# Patient Record
Sex: Female | Born: 1985 | Race: White | Hispanic: No | Marital: Married | Smoking: Never smoker
Health system: Southern US, Community
[De-identification: ages and names within clinical notes are randomized; demographics above are authoritative.]

## PROBLEM LIST (undated history)

## (undated) ENCOUNTER — Inpatient Hospital Stay (HOSPITAL_COMMUNITY): Payer: Self-pay

## (undated) DIAGNOSIS — E119 Type 2 diabetes mellitus without complications: Secondary | ICD-10-CM

---
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
## 2004-01-03 ENCOUNTER — Ambulatory Visit: Payer: Self-pay | Admitting: Internal Medicine

## 2004-01-22 ENCOUNTER — Emergency Department (HOSPITAL_COMMUNITY): Admission: EM | Admit: 2004-01-22 | Discharge: 2004-01-22 | Payer: Self-pay | Admitting: Emergency Medicine

## 2004-04-17 ENCOUNTER — Emergency Department (HOSPITAL_COMMUNITY): Admission: EM | Admit: 2004-04-17 | Discharge: 2004-04-17 | Payer: Self-pay | Admitting: Emergency Medicine

## 2004-04-17 IMAGING — CT CT HEAD W/O CM
1 series · 16 of 30 positions shown, 20 images · IV contrast (agent unspecified)
Comparison: none

CLINICAL DATA: Headache, fever.
CT HEAD WITHOUT CONTRAST:
Multidetector helical CT scanning obtained from the skull base to the vertex.
No evidence of acute intracranial abnormality including mass or mass effect, hydrocephalus, extra-axial fluid collection, midline shift, hemorrhage, infarct.  Acute infarct may be missed by CT for 24-48 hours.  Visualized bony calvarium and paranasal sinuses are unremarkable.

[Series 2: head_seq 4.5 h40s st · axial · 0.43mm/px · z∈[+1025,+1151]mm · 16 of 32 slices shown, 20 images]
[im 2/32  brain]
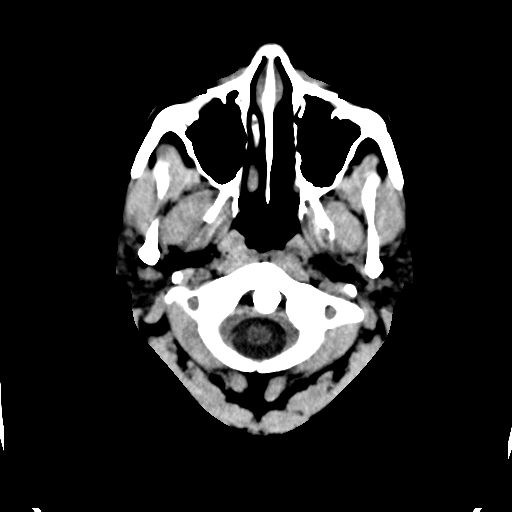
[im 2/32  bone]
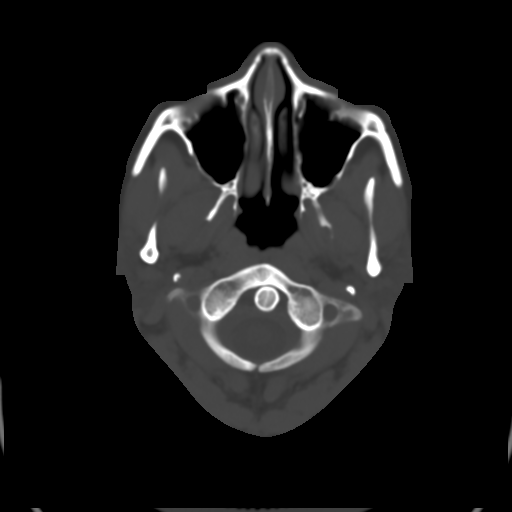
[im 4/32  brain]
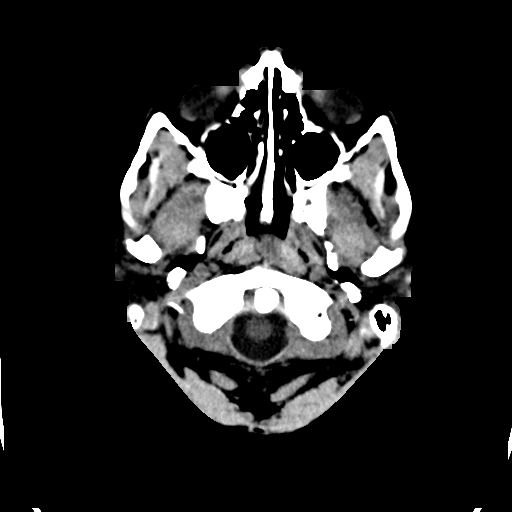
[im 6/32  brain]
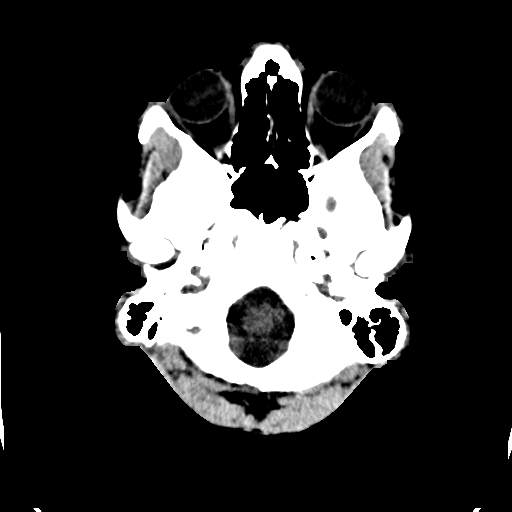
[im 8/32  brain]
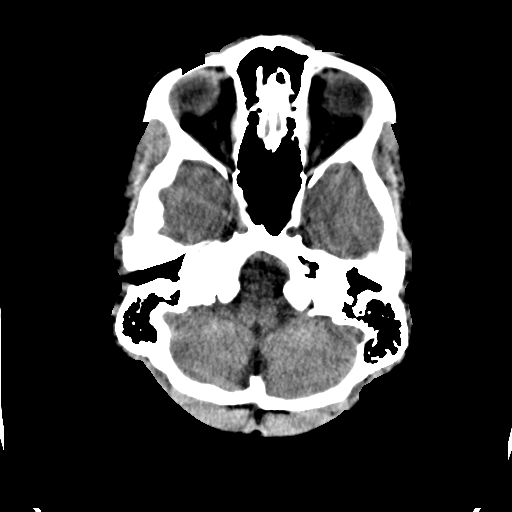
[im 9/32  brain]
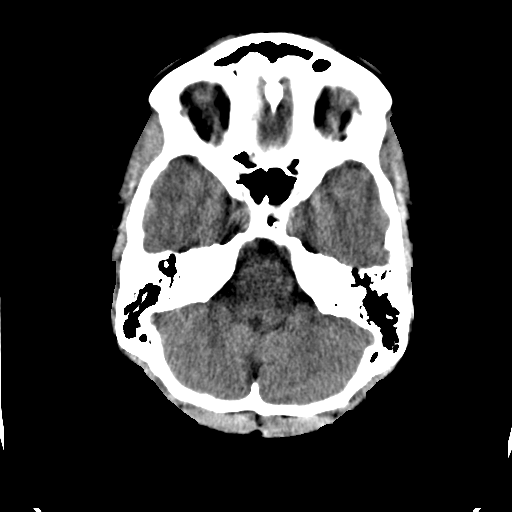
[im 9/32  bone]
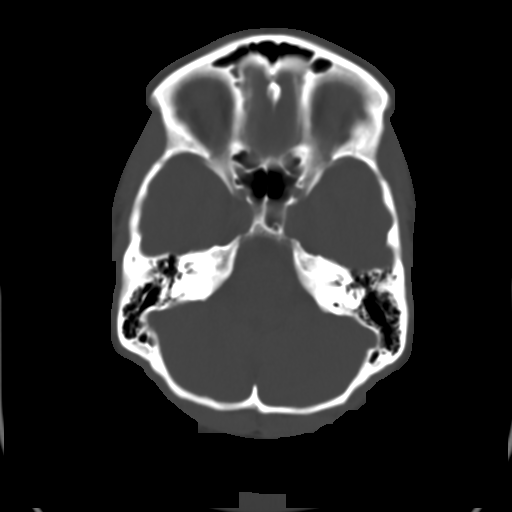
[im 11/32  brain]
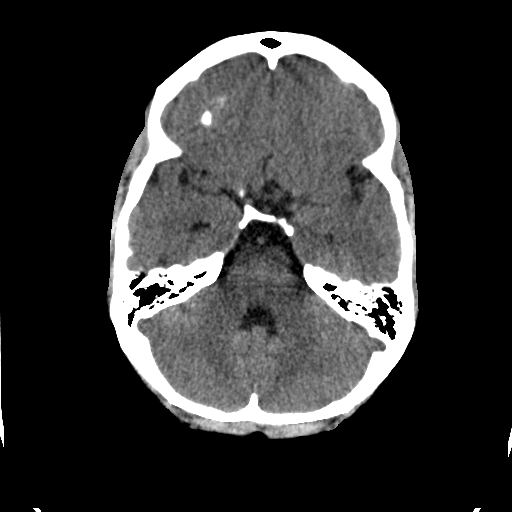
[im 13/32  brain]
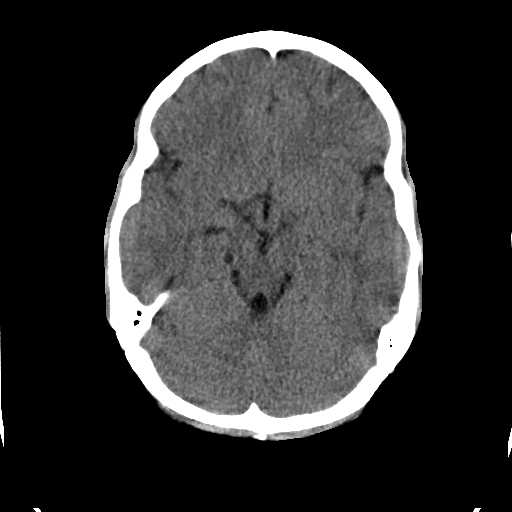
[im 15/32  brain]
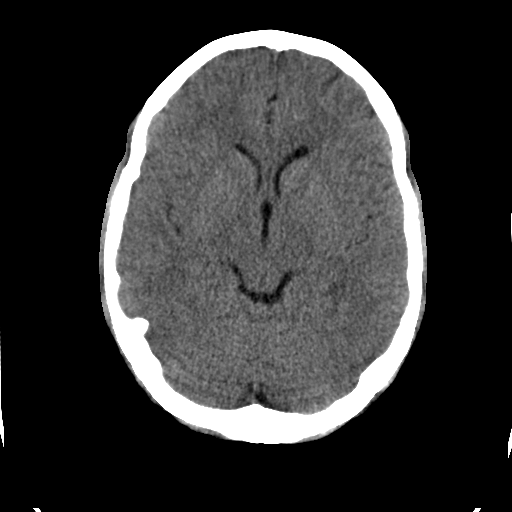
[im 17/32  brain]
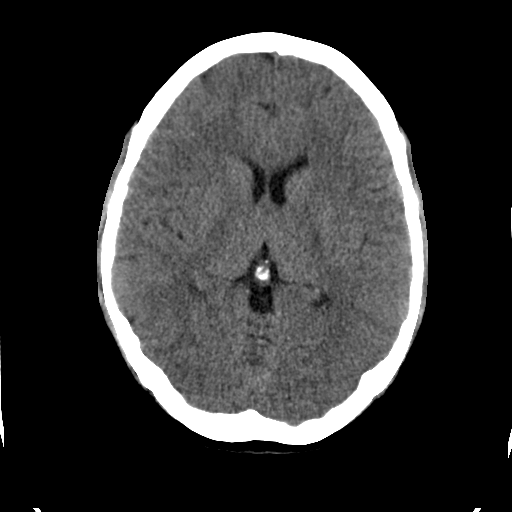
[im 17/32  bone]
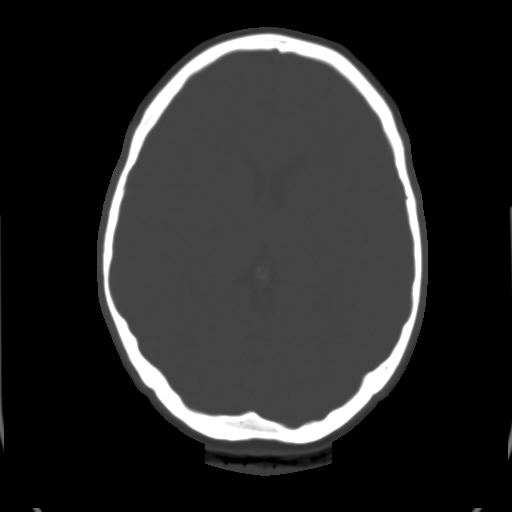
[im 19/32  brain]
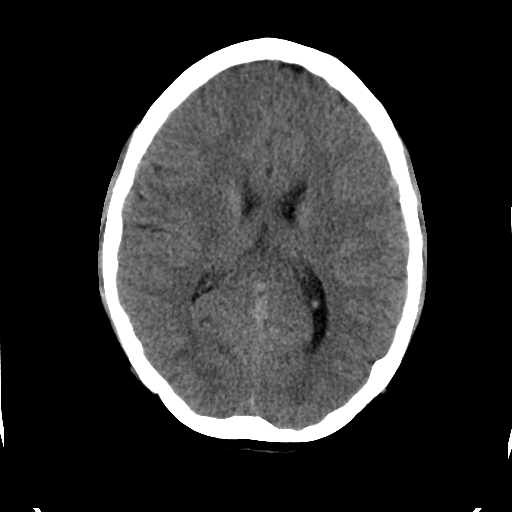
[im 21/32  brain]
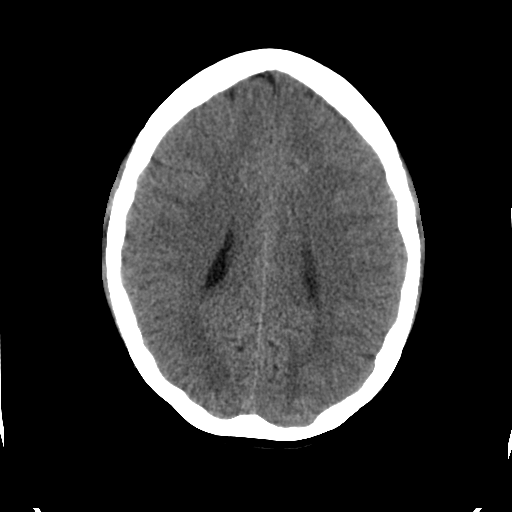
[im 23/32  brain]
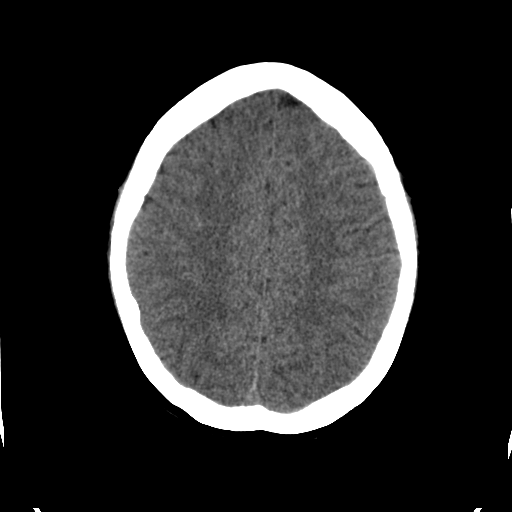
[im 24/32  brain]
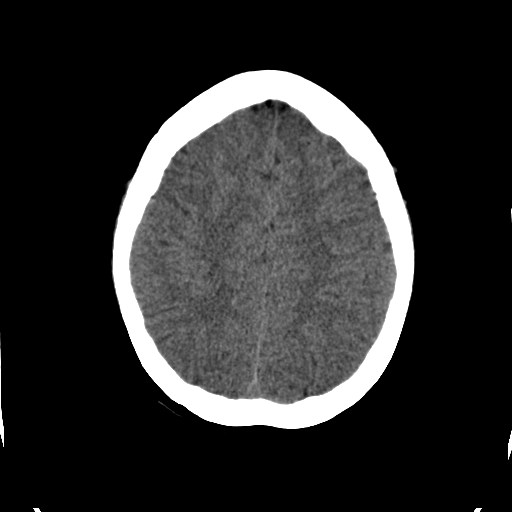
[im 24/32  bone]
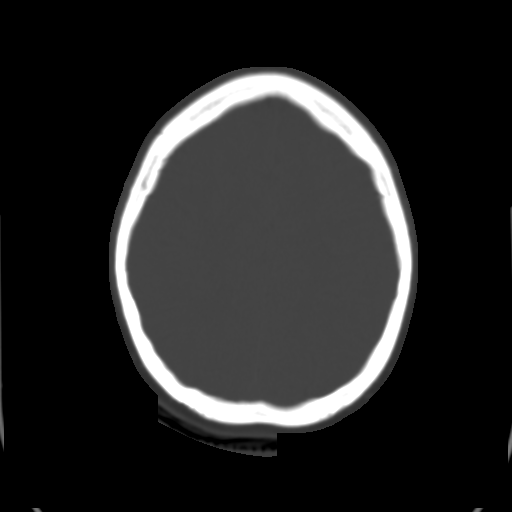
[im 26/32  brain]
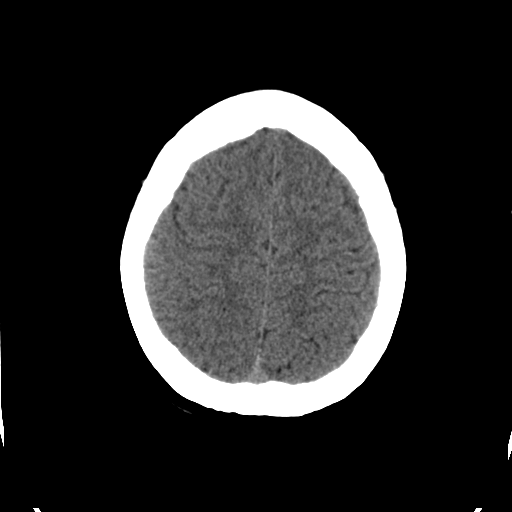
[im 28/32  brain]
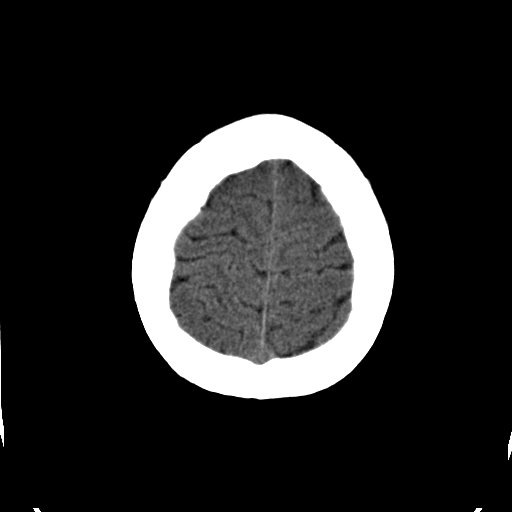
[im 30/32  brain]
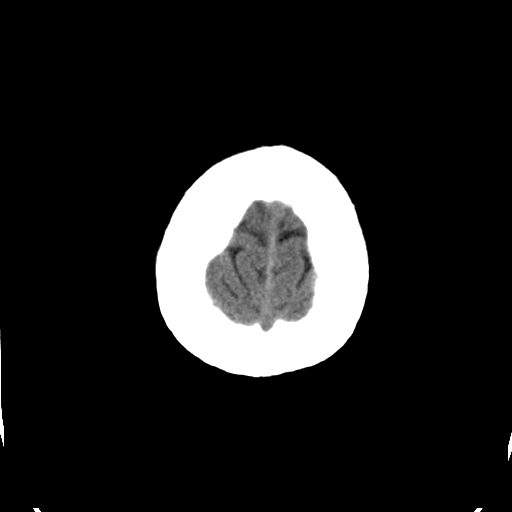

[16 of 30 positions shown; findings below may reference images not displayed]

IMPRESSION: No evidence of acute intracranial abnormality.

## 2005-01-21 ENCOUNTER — Emergency Department (HOSPITAL_COMMUNITY): Admission: EM | Admit: 2005-01-21 | Discharge: 2005-01-21 | Payer: Self-pay | Admitting: Emergency Medicine

## 2007-06-03 ENCOUNTER — Emergency Department (HOSPITAL_COMMUNITY): Admission: EM | Admit: 2007-06-03 | Discharge: 2007-06-03 | Payer: Self-pay | Admitting: Emergency Medicine

## 2007-11-28 ENCOUNTER — Emergency Department (HOSPITAL_COMMUNITY): Admission: EM | Admit: 2007-11-28 | Discharge: 2007-11-28 | Payer: Self-pay | Admitting: Emergency Medicine

## 2008-03-08 ENCOUNTER — Emergency Department (HOSPITAL_COMMUNITY): Admission: EM | Admit: 2008-03-08 | Discharge: 2008-03-08 | Payer: Self-pay | Admitting: Emergency Medicine

## 2010-05-21 LAB — URINALYSIS, ROUTINE W REFLEX MICROSCOPIC
Bilirubin Urine: NEGATIVE
Glucose, UA: 250 mg/dL — AB
Hgb urine dipstick: NEGATIVE
Ketones, ur: NEGATIVE mg/dL
Nitrite: NEGATIVE
Protein, ur: NEGATIVE mg/dL
Specific Gravity, Urine: 1.013 (ref 1.005–1.030)
Urobilinogen, UA: 0.2 mg/dL (ref 0.0–1.0)
pH: 7.5 (ref 5.0–8.0)

## 2010-05-21 LAB — CBC
MCHC: 34.3 g/dL (ref 30.0–36.0)
Platelets: 271 10*3/uL (ref 150–400)
RDW: 12.6 % (ref 11.5–15.5)

## 2010-05-21 LAB — GLUCOSE, CAPILLARY: Glucose-Capillary: 83 mg/dL (ref 70–99)

## 2010-05-21 LAB — COMPREHENSIVE METABOLIC PANEL
ALT: 12 U/L (ref 0–35)
AST: 16 U/L (ref 0–37)
Albumin: 3.3 g/dL — ABNORMAL LOW (ref 3.5–5.2)
Calcium: 8.8 mg/dL (ref 8.4–10.5)
GFR calc Af Amer: >60 mL/min (ref >60)
Glucose, Bld: 105 mg/dL — ABNORMAL HIGH (ref 70–99)
Sodium: 137 mEq/L (ref 135–145)
Total Protein: 6.1 g/dL (ref 6.0–8.3)

## 2010-05-21 LAB — DIFFERENTIAL
Eosinophils Absolute: 0 10*3/uL (ref 0.0–0.7)
Lymphs Abs: 2.3 10*3/uL (ref 0.7–4.0)
Monocytes Absolute: 0.4 10*3/uL (ref 0.1–1.0)
Monocytes Relative: 6 % (ref 3–12)
Neutrophils Relative %: 61 % (ref 43–77)

## 2010-05-21 LAB — POCT PREGNANCY, URINE: Preg Test, Ur: NEGATIVE

## 2010-06-21 NOTE — Discharge Summary (Signed)
[place]  Patient:    [name], [name]                         MRN: [id number] Adm. Date:  [id number] Disc. Date: [id number] Attending:  Julian Hy                           Discharge Summary  DISCHARGE DIAGNOSES: 1. New onset type 1 diabetes with mild diabetic ketoacidosis. 2. Euthyroid goiter. 3. Urinary tract infection. 4. Mild dehydration due to #1.  HISTORY OF PRESENT ILLNESS:  [name] is a 25-6/26 year old white female who presented with a one week history of malaise, excess fatigue, and sleepiness with nocturia one to two times.  She was found to have a new finding of abnormal blood sugar of 387 by fingerstick, an A1C of 13.9%, and had a urinary tract infection also co-existing.  She was admitted, treated with oral hydration and initiation of insulin therapy.  The patient did quite well.  Her insulin needs ended up being significantly more than initially anticipated.  Blood sugars did vary quite a bit.  The patient did well psychologically and extensive teaching was undertaken with the patients family.  Blood sugars did stabilize and she was discharged home in improved condition on May 01, 1999.  Her urinary tract infection symptoms improved during the hospitalization.  She had no evidence of other underlying infection.  She did quite well with all the technical aspects of her diabetes management.  DISCHARGE MEDICATIONS: 1. NPH 14 units at breakfast, 14 units at bedtime. 2. Humalog not if below 100, three units if 101 to 130, five units if 131 to    175, and seven units if over 176.  She was to use this at breakfast and    supper.  DISCHARGE INSTRUCTIONS:  She was to complete Cipro one twice a day for two additional days.  She was to check her sugars before each meal and at bedtime and keep a written record.  She is to call if sugars go below 65 or above 300. She is to follow up in a week to 10 days.  LABORATORY DATA:  Sodium  135, potassium 3.3, chloride 105, CO2 20, BUN 8, creatinine 1.0, glucose 234.  Note, she got six units of Humalog in the office before this measurement was done.  Serum ketones were moderate.  TSH was 0.640, C peptide was 1.6.  HOSPITAL COURSE:  A 26-4/26 year old white female presenting with new onset, type 1 diabetes.  She had moderate ketoacidosis and dehydration with a superimposed problem of a urinary tract infection.  The patient did well with oral hydration, was initiated on insulin therapy and did well.  She was discharged home in improved condition. DD:  05/23/99 TD:  05/23/99 Job: [id number]

## 2010-06-21 NOTE — Discharge Summary (Signed)
[name], [name]                            ACCOUNT NO.:  [id number]   MEDICAL RECORD NO.:  [id number]                   PATIENT TYPE:  INP   LOCATION:  0355                                 FACILITY:  [facility]   PHYSICIAN:  Tera Mater. Evlyn Kanner, M.D.              DATE OF BIRTH:  [birth year]   DATE OF ADMISSION:  09/17/2003  DATE OF DISCHARGE:  09/19/2003                                 DISCHARGE SUMMARY   DISCHARGE DIAGNOSES:  1. Diabetic ketoacidosis, resolved.  2. Known type 1 diabetes with fair diabetes control.  3. Probable otitis media precipitating #1.  4. Minor hypokalemia.   CONSULTATIONS:  None.   IMAGING STUDIES:  None.   HISTORY:  [name] is an 25 year old white female with a known history of  type 1 diabetes for 4 years.  She presented with fairly moderately advanced  diabetic ketoacidosis, to my partner, Dr. Felipa Eth, on September 17, 2003.  She was  treated routinely with IV insulin drip, vigorous fluid resuscitation, and  careful following of her electrolytes.  She was transitioned back to  injectable insulin yesterday morning and had incomplete resolution of her  acidosis.  This is now completely resolved.  She is eating well and fasting  blood sugar is in the acceptable, although not normal range at 170.  She  slept well.  She had no nausea and vomiting.  She has had no significant  electrolytes disarray other than mild hypokalemia today that is  asymptomatic.  She has had no hypoglycemia while here.  She has been seen by  the diabetes coordinator and has a visit in our office on Thursday with our  diabetes educator.  She seems to have returned to baseline.  She has had no  fever while here, but has been covered for infection.  I think I will  continue out a few more days of this just in case this otitis media did  precipitate this.  At the present, she is doing well.   LABORATORY DATA:  Laboratory review at presentation, pH was 7.313, PCO2 of  27.5, O2 of 112, bicarb of  13.5, initial white count was 11,100, hemoglobin  14.5, platelets 227,000.  This morning, the white count is 4900, hemoglobin  12.2, MCV 98.6, platelets 213,000.  Laboratories in the ER showed a sodium  of 133, potassium 3.9, BUN 15, glucose 299, and a fingerstick blood sugar  was over 600 before all of this was done.  Bicarb was 8 in the ER, I  believe.  Initial chemistries, sodium 131, potassium 3.9, chloride 115, CO2  11, BUN 10, creatinine 0.7, glucose was 169.  This morning, laboratories  show sodium 138, potassium 2.2, chloride 110, CO2 25, BUN 5, creatinine 0.6,  glucose 155.  Liver function testing yesterday was perfectly normal with an  alkaline phosphatase of 42, total bilirubin 1.0, SGOT 12, SGPT 9, total  protein 4.8, albumin  2.4, calcium 7.8.  Serum acetone today was negative.  A  urinalysis at presentation, 1.040, glucose greater than 1000, ketones  greater than 80 mg/dL, 30 mg/dL glucose, 0-2 white cells, 11-20 red cells;  they are menses related.   SUMMARY:  We have an 25 year old known type 1 diabetic presenting with  diabetic ketoacidosis to a moderate degree.  She has been treated and doing  well and now is safe to go home.  We will increase her NPH dose to 46 in the  morning and 22 in the evening.  She will continue her Novolog t.i.d. with  the scale as written with carbohydrate-  counting basis.  She will be on Ortho Tri-Cyclen patch or the appropriate  name for this, and she will finish out Cipro 500 mg XL for 5 more days.  She  has an appointment with [place] on Thursday.  Her diet will be as before.  No  pain control is needed.                                               Tera Mater. Evlyn Kanner, M.D.    SAS/MEDQ  D:  09/19/2003  T:  09/19/2003  Job:  [id number]

## 2010-06-21 NOTE — H&P (Signed)
NAMEARLITA, BUFFKIN                            ACCOUNT NO.:  [id number]   MEDICAL RECORD NO.:  [id number]                   PATIENT TYPE:  INP   LOCATION:  1826                                 FACILITY:  [facility]   PHYSICIAN:  Larina Earthly, M.D.                     DATE OF BIRTH:  [birth year]   DATE OF ADMISSION:  09/16/2003  DATE OF DISCHARGE:                                HISTORY & PHYSICAL   CHIEF COMPLAINT:  Headache and fatigue.   HISTORY OF PRESENT ILLNESS:  This is an 25 year old Caucasian female who has  had diabetes for approximately 4 years and no history of DKA.  She working  full time at the [employer] and recently had a miscarriage, approximately 6  weeks ago and was put on hormone patches by Dr. Billy Coast with plans to remove  the patch on September 17, 2003 to precipitate a menstrual period. Her diabetic  control has been poor and indeed when she was last seen in the office  approximately 1 month ago her hemoglobin A1C was in excess of 10%.  She was  placed on a new Insulin regimen consisting of InnoLet with NPH and Novolog  sliding scale.  With this regimen patient states that her a.m. CBCs have  been ranging in the 80 to 90 range and prior to lunch and dinner her blood  sugars have been ranging in the 170 to 180 range and at bedtime her blood  sugars have ranging in the 250 range.   On September 15, 2003 the patient began experiencing malaise all day but no  other specific symptoms and then on the early morning of September 16, 2003 the  patient awoke with a headache and vertigo with a blood sugar of 148.  As the  day progressed she did experience nausea but no vomiting, along with  anorexia.  Her blood sugars rose to the 280 to 290 range.  She also had some  visual abnormalities consisting of spots and abdominal cramping on the right  side which she attributed to an ensuing menstrual period.  However given the  continuation of her symptoms she presented to the emergency room where  she  was found to be in DKA as evidenced by labs below.  She was initiated on IV  fluids and a ________.   REVIEW OF SYSTEMS:  As above, otherwise essentially negative but  specifically negative for chest pain, shortness of breath, no  musculoskeletal or neurological deficits with the exception of seeing spots  in her vision which has since resolved in the emergency room with IV fluids.   PROBLEM LIST:  1. Type 1 diabetes dated back to the age of 47.  2. Miscarriage approximately 6 weeks ago.   CURRENT MEDICATIONS:  1. InnoLet NPH 44 units in the morning and 18 units in the evening.  2. Novolog t.i.d. with sliding  scale prior to meals.  3. Ortho-Tri-Cyclen patch, however question the name of the patch.   ALLERGIES:  No known drug allergies.   SOCIAL HISTORY:  The patient is single.  She has no tobacco, alcohol or drug  abuse history.  Works at [employer] full time.   FAMILY HISTORY:  Significant for type 2 diabetes in grandparents along with  hypertension, hyperlipidemia and cerebrovascular accident.   LABORATORY DATA:  White blood cell count 10,100, hemoglobin 17.5, hematocrit  50%, platelet count 382,000.  Sodium 133, potassium 3.9, BUN 15, glucose  299.  Her pH was 7.066, PCO2 12, PO2 130, bicarb 3.8, 97% oxygen saturation.  Urinalysis negative.   PHYSICAL EXAMINATION:  GENERAL:  We have a pleasant Caucasian female lying  in bed in no apparent distress answering all questions appropriately,  feeling better after IV fluid administration.  VITAL SIGNS:  Temperature 98 degrees, blood pressure 107/56, pulse 103,  respirations 19, oxygen saturation 100%.  HEENT:  Sclerae anicteric.  Extraocular movements are intact. There are no  oropharyngeal lesions.  There is no significant rhinitis.  Tympanic  membranes are red bilaterally with effusion.  NECK:  Supple, there is no cervical lymphadenopathy.  No thyromegaly.  LUNGS:  Clear to auscultation bilaterally.  CARDIOVASCULAR:   Regular rate and rhythm. There is no axillary adenopathy.  ABDOMEN:  Soft, nontender, nondistended.  Bowel sounds are present.  EXTREMITIES:  No edema.  Pedal pulses are intact. There is no active  synovitis.  Full range of motion in all 4 extremities.  NEUROLOGIC:  Exam is grossly nonfocal.   ASSESSMENT/PLAN:  1. Diabetic ketoacidosis. We will provide customary IV fluids, serial     assessment of bicarb and renal and electrolyte parameters.  Will add     potassium when potassium is less than 4.5 and will start _________ and     add D5 to IV fluids when CBG is less than 250.   1. Otitis media.  Will give Rocephin and mucolytic agents and monitor for     improvement.   1. The patient will need education for DKA and Insulin management which is     planned for out office this coming week with our certified diabetes     educator.                                                Larina Earthly, M.D.    RA/MEDQ  D:  09/17/2003  T:  09/17/2003  Job:  [id number]   cc:   Jeannett Senior A. Evlyn Kanner, M.D.  [address]  [place]  [place] [address]  Fax: [phone]   Lenoard Aden, M.D.  [address]  [place]  [place] [address]  Fax: [phone]

## 2010-10-29 LAB — COMPREHENSIVE METABOLIC PANEL WITH GFR
ALT: 9
AST: 17
Albumin: 2.8 — ABNORMAL LOW
CO2: 22
Calcium: 8.4
Creatinine, Ser: 0.93
GFR calc Af Amer: >60
GFR calc non Af Amer: >60
Sodium: 138

## 2010-10-29 LAB — COMPREHENSIVE METABOLIC PANEL
Alkaline Phosphatase: 44
BUN: 16
Chloride: 109
Glucose, Bld: 201 — ABNORMAL HIGH
Potassium: 4.4
Total Bilirubin: 2.2 — ABNORMAL HIGH
Total Protein: 5.8 — ABNORMAL LOW

## 2010-10-29 LAB — DIFFERENTIAL
Basophils Absolute: 0
Basophils Relative: 0
Eosinophils Absolute: 0
Eosinophils Relative: 0
Lymphocytes Relative: 3 — ABNORMAL LOW
Lymphs Abs: 0.3 — ABNORMAL LOW
Monocytes Absolute: 0.3
Monocytes Relative: 3
Neutro Abs: 9.4 — ABNORMAL HIGH
Neutrophils Relative %: 94 — ABNORMAL HIGH

## 2010-10-29 LAB — URINALYSIS, ROUTINE W REFLEX MICROSCOPIC
Glucose, UA: >1000 — AB
Hgb urine dipstick: NEGATIVE
Ketones, ur: >80 — AB
Leukocytes, UA: NEGATIVE
Nitrite: POSITIVE — AB
Protein, ur: NEGATIVE
Specific Gravity, Urine: 1.034 — ABNORMAL HIGH
Urobilinogen, UA: 0.2
pH: 5.5

## 2010-10-29 LAB — CBC
HCT: 39.5
Hemoglobin: 14.1
MCHC: 35.7
MCV: 90.8
Platelets: 220
RBC: 4.34
RDW: 12.2
WBC: 10

## 2010-10-29 LAB — POCT PREGNANCY, URINE: Operator id: 285841

## 2010-10-29 LAB — URINE MICROSCOPIC-ADD ON

## 2010-11-04 LAB — GLUCOSE, CAPILLARY

## 2014-03-15 ENCOUNTER — Other Ambulatory Visit: Payer: Self-pay | Admitting: Obstetrics and Gynecology

## 2014-03-15 ENCOUNTER — Encounter (HOSPITAL_COMMUNITY): Payer: Self-pay | Admitting: *Deleted

## 2014-03-15 NOTE — H&P (Signed)
[name]:  [name], [name]                ACCOUNT NO.:  [id number]  MEDICAL RECORD NO.:  [id number]  LOCATION:  [facility]                         FACILITY:  [facility]  PHYSICIAN:  Lenoard Adenichard J. [name], M.D.DATE OF BIRTH:  04-May-1985  DATE OF ADMISSION:  03/15/2014 DATE OF DISCHARGE:                             HISTORY & PHYSICAL   CHIEF COMPLAINT:  Missed AB.  HISTORY OF PRESENT ILLNESS:  A 29 year old white female G2, P0 who presents with missed AB.  MEDICATIONS:  Include NovoLog, vitamin, and prenatal vitamins.  SOCIAL HISTORY:  She is a nonsmoker, nondrinker.  Denies domestic or physical violence.  ALLERGIES:  She has no known drug allergies.  FAMILY HISTORY:  Lung cancer, diabetes, melanoma, heart disease, preeclampsia, and hypertension.  PAST SURGICAL HISTORY:  Noncontributory except for miscarriage x1.  PAST MEDICAL HISTORY:  Medical problems to include insulin-dependent diabetes.  PHYSICAL EXAMINATION:  GENERAL:  She is a well-developed, well- nourished, white female, in no acute distress.  HEENT:  Normal.  NECK:  Supple.  Full range of motion.  LUNGS:  Clear to auscultation.  ABDOMEN:  Soft, nontender.  PELVIC:  Bulky, anteflexed uterus.  No adnexal masses.  EXTREMITIES:  There are no cords.  NEUROLOGIC:  Nonfocal.  SKIN:  Intact.  IMPRESSION: 1. Missed abortion. 2. Insulin-dependent diabetes.  PLAN:  Proceed with debridement.  Risks of anesthesia, infection, bleeding, injury to surrounding organs, possible need for repair was discussed.  Delayed versus immediate complications to include bowel and bladder injury noted.  The patient acknowledges and wishes to proceed.     Lenoard Adenichard J. [name], M.D.     RJT/MEDQ  D:  03/15/2014  T:  03/15/2014  Job:  [id number]

## 2014-03-16 ENCOUNTER — Ambulatory Visit (HOSPITAL_COMMUNITY)
Admission: RE | Admit: 2014-03-16 | Discharge: 2014-03-16 | Disposition: A | Discharge status: Home / Self Care | Payer: BLUE CROSS/BLUE SHIELD | Source: Ambulatory Visit | Attending: Obstetrics and Gynecology | Admitting: Obstetrics and Gynecology

## 2014-03-16 ENCOUNTER — Encounter (HOSPITAL_COMMUNITY)
Admission: RE | Disposition: A | Discharge status: Home / Self Care | Payer: Self-pay | Source: Ambulatory Visit | Attending: Obstetrics and Gynecology

## 2014-03-16 ENCOUNTER — Ambulatory Visit (HOSPITAL_COMMUNITY): Payer: BLUE CROSS/BLUE SHIELD | Admitting: Anesthesiology

## 2014-03-16 ENCOUNTER — Encounter (HOSPITAL_COMMUNITY): Payer: Self-pay | Admitting: *Deleted

## 2014-03-16 DIAGNOSIS — Z3A01 Less than 8 weeks gestation of pregnancy: Secondary | ICD-10-CM | POA: Diagnosis not present

## 2014-03-16 DIAGNOSIS — O021 Missed abortion: Secondary | ICD-10-CM | POA: Insufficient documentation

## 2014-03-16 DIAGNOSIS — Z794 Long term (current) use of insulin: Secondary | ICD-10-CM | POA: Insufficient documentation

## 2014-03-16 DIAGNOSIS — E119 Type 2 diabetes mellitus without complications: Secondary | ICD-10-CM | POA: Insufficient documentation

## 2014-03-16 HISTORY — DX: Type 2 diabetes mellitus without complications: E11.9

## 2014-03-16 HISTORY — PX: DILATION AND EVACUATION: SHX1459

## 2014-03-16 LAB — CBC
HEMATOCRIT: 40.3 % (ref 36.0–46.0)
Hemoglobin: 14.3 g/dL (ref 12.0–15.0)
MCH: 32.1 pg (ref 26.0–34.0)
MCHC: 35.5 g/dL (ref 30.0–36.0)
MCV: 90.6 fL (ref 78.0–100.0)
PLATELETS: 238 10*3/uL (ref 150–400)
RBC: 4.45 MIL/uL (ref 3.87–5.11)
RDW: 12.5 % (ref 11.5–15.5)
WBC: 6.1 10*3/uL (ref 4.0–10.5)

## 2014-03-16 LAB — BASIC METABOLIC PANEL
ANION GAP: 3 — AB (ref 5–15)
BUN: 11 mg/dL (ref 6–23)
CALCIUM: 9 mg/dL (ref 8.4–10.5)
CHLORIDE: 105 mmol/L (ref 96–112)
CO2: 26 mmol/L (ref 19–32)
Creatinine, Ser: 0.66 mg/dL (ref 0.50–1.10)
GFR calc Af Amer: >90 mL/min (ref >90)
GFR calc non Af Amer: >90 mL/min (ref >90)
GLUCOSE: 247 mg/dL — AB (ref 70–99)
Potassium: 4.1 mmol/L (ref 3.5–5.1)
SODIUM: 134 mmol/L — AB (ref 135–145)

## 2014-03-16 LAB — ABO/RH: ABO/RH(D): B POS

## 2014-03-16 LAB — GLUCOSE, CAPILLARY: GLUCOSE-CAPILLARY: 183 mg/dL — AB (ref 70–99)

## 2014-03-16 SURGERY — DILATION AND EVACUATION, UTERUS
Anesthesia: Monitor Anesthesia Care | Site: Uterus

## 2014-03-16 MED ORDER — PROMETHAZINE HCL 25 MG/ML IJ SOLN: 6.2500 mg | INTRAMUSCULAR | Status: DC | PRN

## 2014-03-16 MED ORDER — PHENYLEPHRINE 40 MCG/ML (10ML) SYRINGE FOR IV PUSH (FOR BLOOD PRESSURE SUPPORT)
PREFILLED_SYRINGE | INTRAVENOUS | Status: AC
  Filled 2014-03-16: qty 10

## 2014-03-16 MED ORDER — BUPIVACAINE HCL (PF) 0.25 % IJ SOLN
INTRAMUSCULAR | Status: AC
  Filled 2014-03-16: qty 30

## 2014-03-16 MED ORDER — ACETAMINOPHEN 325 MG PO TABS: 325.0000 mg | ORAL_TABLET | ORAL | Status: DC | PRN

## 2014-03-16 MED ORDER — DEXAMETHASONE SODIUM PHOSPHATE 10 MG/ML IJ SOLN
INTRAMUSCULAR | Status: DC | PRN
  Administered 2014-03-16 (×2): 2 mg via INTRAVENOUS

## 2014-03-16 MED ORDER — PROPOFOL 10 MG/ML IV BOLUS
INTRAVENOUS | Status: AC
  Filled 2014-03-16: qty 20

## 2014-03-16 MED ORDER — FENTANYL CITRATE 0.05 MG/ML IJ SOLN
INTRAMUSCULAR | Status: AC
Start: 2014-03-16 — End: 2014-03-16
  Filled 2014-03-16: qty 2

## 2014-03-16 MED ORDER — MIDAZOLAM HCL 2 MG/2ML IJ SOLN
INTRAMUSCULAR | Status: DC | PRN
  Administered 2014-03-16: 2 mg via INTRAVENOUS

## 2014-03-16 MED ORDER — 0.9 % SODIUM CHLORIDE (POUR BTL) OPTIME
TOPICAL | Status: DC | PRN
  Administered 2014-03-16: 1000 mL

## 2014-03-16 MED ORDER — LACTATED RINGERS IV SOLN
INTRAVENOUS | Status: DC
  Administered 2014-03-16 (×2): via INTRAVENOUS

## 2014-03-16 MED ORDER — MEPERIDINE HCL 25 MG/ML IJ SOLN: 6.2500 mg | INTRAMUSCULAR | Status: DC | PRN

## 2014-03-16 MED ORDER — MIDAZOLAM HCL 2 MG/2ML IJ SOLN
INTRAMUSCULAR | Status: AC
  Filled 2014-03-16: qty 2

## 2014-03-16 MED ORDER — ACETAMINOPHEN 160 MG/5ML PO SOLN: 325.0000 mg | ORAL | Status: DC | PRN

## 2014-03-16 MED ORDER — KETOROLAC TROMETHAMINE 30 MG/ML IJ SOLN: 30.0000 mg | Freq: Once | INTRAMUSCULAR | Status: DC | PRN

## 2014-03-16 MED ORDER — FENTANYL CITRATE 0.05 MG/ML IJ SOLN: 25.0000 ug | INTRAMUSCULAR | Status: DC | PRN

## 2014-03-16 MED ORDER — LIDOCAINE HCL (CARDIAC) 20 MG/ML IV SOLN
INTRAVENOUS | Status: DC | PRN
  Administered 2014-03-16: 50 mg via INTRAVENOUS

## 2014-03-16 MED ORDER — MIDAZOLAM HCL 2 MG/2ML IJ SOLN: 0.5000 mg | Freq: Once | INTRAMUSCULAR | Status: DC | PRN

## 2014-03-16 MED ORDER — BUPIVACAINE HCL (PF) 0.25 % IJ SOLN
INTRAMUSCULAR | Status: DC | PRN
Start: 2014-03-16 — End: 2014-03-16
  Administered 2014-03-16: 20 mL

## 2014-03-16 MED ORDER — KETOROLAC TROMETHAMINE 30 MG/ML IJ SOLN
INTRAMUSCULAR | Status: DC | PRN
  Administered 2014-03-16: 30 mg via INTRAVENOUS

## 2014-03-16 MED ORDER — CEFAZOLIN SODIUM-DEXTROSE 2-3 GM-% IV SOLR
2.0000 g | INTRAVENOUS | Status: AC
  Administered 2014-03-16: 2 g via INTRAVENOUS

## 2014-03-16 MED ORDER — CEFAZOLIN SODIUM-DEXTROSE 2-3 GM-% IV SOLR
INTRAVENOUS | Status: AC
  Filled 2014-03-16: qty 50

## 2014-03-16 MED ORDER — HYDROCODONE-IBUPROFEN 7.5-200 MG PO TABS
1.0000 | ORAL_TABLET | Freq: Three times a day (TID) | ORAL | Status: DC | PRN
Start: 2014-03-16 — End: 2016-05-15

## 2014-03-16 MED ORDER — DEXAMETHASONE SODIUM PHOSPHATE 4 MG/ML IJ SOLN
INTRAMUSCULAR | Status: AC
  Filled 2014-03-16: qty 1

## 2014-03-16 MED ORDER — KETOROLAC TROMETHAMINE 30 MG/ML IJ SOLN
INTRAMUSCULAR | Status: AC
  Filled 2014-03-16: qty 1

## 2014-03-16 MED ORDER — LIDOCAINE HCL (CARDIAC) 20 MG/ML IV SOLN
INTRAVENOUS | Status: AC
  Filled 2014-03-16: qty 5

## 2014-03-16 MED ORDER — ONDANSETRON HCL 4 MG/2ML IJ SOLN
INTRAMUSCULAR | Status: AC
  Filled 2014-03-16: qty 2

## 2014-03-16 MED ORDER — PROPOFOL 10 MG/ML IV BOLUS
INTRAVENOUS | Status: DC | PRN
  Administered 2014-03-16 (×3): 20 mg via INTRAVENOUS

## 2014-03-16 MED ORDER — FENTANYL CITRATE 0.05 MG/ML IJ SOLN
INTRAMUSCULAR | Status: DC | PRN
  Administered 2014-03-16: 100 ug via INTRAVENOUS

## 2014-03-16 MED ORDER — ONDANSETRON HCL 4 MG/2ML IJ SOLN
INTRAMUSCULAR | Status: DC | PRN
  Administered 2014-03-16: 4 mg via INTRAVENOUS

## 2014-03-16 SURGICAL SUPPLY — 19 items
CATH ROBINSON RED A/P 16FR (CATHETERS) ×3 IMPLANT
CLOTH BEACON ORANGE TIMEOUT ST (SAFETY) ×3 IMPLANT
DECANTER SPIKE VIAL GLASS SM (MISCELLANEOUS) ×3 IMPLANT
GLOVE BIO SURGEON STRL SZ7.5 (GLOVE) ×3 IMPLANT
GLOVE BIOGEL PI IND STRL 7.0 (GLOVE) IMPLANT
GLOVE BIOGEL PI INDICATOR 7.0 (GLOVE) ×4
GLOVE ECLIPSE 7.0 STRL STRAW (GLOVE) ×2 IMPLANT
GOWN STRL REUS W/TWL LRG LVL3 (GOWN DISPOSABLE) ×6 IMPLANT
KIT BERKELEY 1ST TRIMESTER 3/8 (MISCELLANEOUS) ×3 IMPLANT
NS IRRIG 1000ML POUR BTL (IV SOLUTION) ×3 IMPLANT
PACK VAGINAL MINOR WOMEN LF (CUSTOM PROCEDURE TRAY) ×3 IMPLANT
PAD OB MATERNITY 4.3X12.25 (PERSONAL CARE ITEMS) ×3 IMPLANT
PAD PREP 24X48 CUFFED NSTRL (MISCELLANEOUS) ×3 IMPLANT
SET BERKELEY SUCTION TUBING (SUCTIONS) ×3 IMPLANT
TOWEL OR 17X24 6PK STRL BLUE (TOWEL DISPOSABLE) ×6 IMPLANT
VACURETTE 10 RIGID CVD (CANNULA) IMPLANT
VACURETTE 7MM CVD STRL WRAP (CANNULA) ×2 IMPLANT
VACURETTE 8 RIGID CVD (CANNULA) IMPLANT
VACURETTE 9 RIGID CVD (CANNULA) IMPLANT

## 2014-03-16 NOTE — Anesthesia Preprocedure Evaluation (Signed)
Anesthesia Evaluation  Patient identified by MRN, date of birth, ID band Patient awake    Reviewed: Allergy & Precautions, H&P , Patient's Chart, lab work & pertinent test results, reviewed documented beta blocker date and time   History of Anesthesia Complications Negative for: history of anesthetic complications  Airway Mallampati: II  TM Distance: >3 FB Neck ROM: full    Dental   Pulmonary  breath sounds clear to auscultation        Cardiovascular Exercise Tolerance: Good Rhythm:regular Rate:Normal     Neuro/Psych negative psych ROS   GI/Hepatic   Endo/Other  diabetes  Renal/GU      Musculoskeletal   Abdominal   Peds  Hematology   Anesthesia Other Findings   Reproductive/Obstetrics                             Anesthesia Physical Anesthesia Plan  ASA: III  Anesthesia Plan: MAC   Post-op Pain Management:    Induction:   Airway Management Planned:   Additional Equipment:   Intra-op Plan:   Post-operative Plan:   Informed Consent: I have reviewed the patients History and Physical, chart, labs and discussed the procedure including the risks, benefits and alternatives for the proposed anesthesia with the patient or authorized representative who has indicated his/her understanding and acceptance.   Dental Advisory Given  Plan Discussed with: CRNA, Surgeon and Anesthesiologist  Anesthesia Plan Comments:         Anesthesia Quick Evaluation

## 2014-03-16 NOTE — Discharge Instructions (Signed)
DISCHARGE INSTRUCTIONS: D&C / D&E The following instructions have been prepared to help you care for yourself upon your return home.   Personal hygiene:  Use sanitary pads for vaginal drainage, not tampons.  Shower the day after your procedure.  NO tub baths, pools or Jacuzzis for 2-3 weeks.  Wipe front to back after using the bathroom.  Activity and limitations:  Do NOT drive or operate any equipment for 24 hours. The effects of anesthesia are still present and drowsiness may result.  Do NOT rest in bed all day.  Walking is encouraged.  Walk up and down stairs slowly.  You may resume your normal activity in one to two days or as indicated by your physician.  Sexual activity: NO intercourse for at least 2 weeks after the procedure, or as indicated by your physician.  Diet: Eat a light meal as desired this evening. You may resume your usual diet tomorrow.  Return to work: You may resume your work activities in one to two days or as indicated by your doctor.  What to expect after your surgery: Expect to have vaginal bleeding/discharge for 2-3 days and spotting for up to 10 days. It is not unusual to have soreness for up to 1-2 weeks. You may have a slight burning sensation when you urinate for the first day. Mild cramps may continue for a couple of days. You may have a regular period in 2-6 weeks.  NO IBUPROFEN PRODUCTS (MOTRIN, ADVIL) OR ALEVE UNTIL 6:15 PM TODAY.   Call your doctor for any of the following:  Excessive vaginal bleeding, saturating and changing one pad every hour.  Inability to urinate 6 hours after discharge from hospital.  Pain not relieved by pain medication.  Fever of 100.4 F or greater.  Unusual vaginal discharge or odor.   Call for an appointment:    Patients signature: ______________________  Nurses signature ________________________  Support person's signature_______________________

## 2014-03-16 NOTE — Op Note (Signed)
03/16/2014  12:20 PM  PATIENT:  [name]  29 y.o. female  PRE-OPERATIVE DIAGNOSIS:  Missed Abortion Recurrent Pregnancy Loss  POST-OPERATIVE DIAGNOSIS:  Missed Abortion Recurrent pregnancy loss  PROCEDURE:  Procedure(s): DILATATION AND EVACUATION  SURGEON:  Surgeon(s): Lenoard Adenichard J Ishita Mcnerney, MD  ASSISTANTS: none   ANESTHESIA:   local and IV sedation  ESTIMATED BLOOD LOSS: minimal  DRAINS: none   LOCAL MEDICATIONS USED:  MARCAINE    and Amount: 20 ml  SPECIMEN:  Source of Specimen:  POC  DISPOSITION OF SPECIMEN:  PATHOLOGY  COUNTS:  YES  DICTATION #: [id number]: [id number]  PLAN OF CARE: dc home  PATIENT DISPOSITION:  PACU - hemodynamically stable.

## 2014-03-16 NOTE — Transfer of Care (Signed)
Immediate Anesthesia Transfer of Care Note  Patient: [name] GripSara A Rollins  Procedure(s) Performed: Procedure(s): DILATATION AND EVACUATION (N/A)  Patient Location: PACU  Anesthesia Type:MAC  Level of Consciousness: sedated  Airway & Oxygen Therapy: Patient Spontanous Breathing  Post-op Assessment: Report given to RN  Post vital signs: Reviewed and stable  Last Vitals:  Filed Vitals:   03/16/14 1000  BP: 122/73  Pulse: 85  Temp: 36.8 C  Resp: 20    Complications: No apparent anesthesia complications

## 2014-03-16 NOTE — Progress Notes (Signed)
Patient ID: [name] GripSara A Sturges, female   DOB: 1985/12/14, 29 y.o.   MRN: [id number] Patient seen and examined. Consent witnessed and signed. No changes noted. Update completed.

## 2014-03-16 NOTE — Anesthesia Postprocedure Evaluation (Signed)
  Anesthesia Post-op Note  Patient: [name]  Procedure(s) Performed: Procedure(s): DILATATION AND EVACUATION (N/A) Patient is awake and responsive. Pain and nausea are reasonably well controlled. Vital signs are stable and clinically acceptable. Oxygen saturation is clinically acceptable. There are no apparent anesthetic complications at this time. Patient is ready for discharge.

## 2014-03-17 NOTE — Op Note (Signed)
[name]:  [name], [name]                ACCOUNT NO.:  [id number]  MEDICAL RECORD NO.:  [id number]  LOCATION:  [facility]                          [facility]ACILITY:  [facility]  PHYSICIAN:  Lenoard Adenichard J. Gladys Gutman, M.D.DATE OF BIRTH:  1985/05/03  DATE OF PROCEDURE:  03/16/2014 DATE OF DISCHARGE:  03/16/2014                              OPERATIVE REPORT   PREOPERATIVE DIAGNOSIS:  Missed abortion.  Recurrent pregnancy loss.  POSTOPERATIVE DIAGNOSIS:  Missed abortion.  Recurrent pregnancy loss.  PROCEDURE:  Suction dilation and evacuation.  SURGEON:  Lenoard Adenichard J. Ivee Poellnitz, MD  ASSISTANT:  None.  ANESTHESIA:  Local and IV sedation.  ESTIMATED BLOOD LOSS:  Less than 50 mL.  COMPLICATIONS:  None.  SPECIMEN:  Products of conception to Pathology.  The patient to recovery in good condition.  BRIEF OPERATIVE NOTE:  After being apprised of risks of anesthesia, infection, bleeding in surrounding organs, possible need for repair, delayed versus immediate complications to include bowel and bladder injury with need for repair, the patient was brought to the operating room where she was administered IV sedation without difficulty.  Prepped and draped in usual sterile fashion.  Catheterized until the bladder was empty.  Exam under anesthesia revealed 6-8 week size bulky mid positioned uterus and no adnexal masses.  Cervix easily dilated up to a 23 Pratt dilator.  A 7-mm suction curette placed.  Aspiration revealed products of conception.  Repeat suction and blunt curettage in a 4- quadrant method revealed the cavity to be empty.  Good hemostasis was noted.  All instruments were removed.  Please note that prior to the procedure, dilute paracervical block with 20 mL of dilute Marcaine solution was placed.  The patient tolerated the procedure well, was awakened, and transferred to recovery in good condition.     Lenoard Adenichard J. Ilijah Doucet, M.D.    RJT/MEDQ  D:  03/16/2014  T:  03/17/2014  Job:  [id number]

## 2014-03-18 ENCOUNTER — Encounter (HOSPITAL_COMMUNITY): Payer: Self-pay | Admitting: Obstetrics and Gynecology

## 2014-12-14 ENCOUNTER — Ambulatory Visit (HOSPITAL_COMMUNITY): Payer: BLUE CROSS/BLUE SHIELD

## 2015-01-11 ENCOUNTER — Ambulatory Visit (HOSPITAL_COMMUNITY): Payer: BLUE CROSS/BLUE SHIELD | Attending: Obstetrics and Gynecology

## 2015-11-22 LAB — OB RESULTS CONSOLE HIV ANTIBODY (ROUTINE TESTING): HIV: NONREACTIVE

## 2015-11-22 LAB — OB RESULTS CONSOLE GC/CHLAMYDIA
CHLAMYDIA, DNA PROBE: NEGATIVE
Gonorrhea: NEGATIVE

## 2015-11-22 LAB — OB RESULTS CONSOLE ANTIBODY SCREEN: Antibody Screen: NEGATIVE

## 2015-11-22 LAB — OB RESULTS CONSOLE ABO/RH: RH Type: POSITIVE

## 2015-11-22 LAB — OB RESULTS CONSOLE RPR: RPR: NONREACTIVE

## 2015-11-22 LAB — OB RESULTS CONSOLE HEPATITIS B SURFACE ANTIGEN: Hepatitis B Surface Ag: NEGATIVE

## 2016-02-04 NOTE — L&D Delivery Note (Signed)
Operative Delivery Note At 3:51 PM a viable female was delivered via .  Presentation: vertex; Position: Right,, Occiput,, Anterior; Station: +3.  Mushroom vacuum used delivered head easily with 4 ucs No popoffs  Delivery of the head:  Easily and shoulder dystocia noted  ,   Second maneuver:McRoberts Maneuver  ,   Third maneuver: Suprapubic pressure ,   Fourth maneuverDelivery of left posterior arm and shoulders then easily delivered: ,   Fifth maneuver: ,   Sixth maneuver: ,    Verbal consent: obtained from patient.  APGAR: , 7 9  weightpending  .   Placenta status:normal spontaneously with 3 vessel cord , .   Cord:  with the following complications:none .  Cord pH: not obtained  Anesthesia: local  Episiotomy: midline  Lacerations: 4th degree  Laceration - rectal mucosa closed with 2.0 chromic Sphincter closed with 3.0 vicryl Remainder closed with 2.0 chromic Suture Repair: 2.0 3.0 chromic vicryl Est. Blood Loss (mL):  300 Mom to postpartum.  Baby to Couplet care / Skin to Skin.  Baby moving both arms normally in the delivery room  Avory Rahimi L 06/20/2016, 4:19 PM

## 2016-04-07 DIAGNOSIS — Z362 Encounter for other antenatal screening follow-up: Secondary | ICD-10-CM | POA: Diagnosis not present

## 2016-04-07 DIAGNOSIS — Z3A28 28 weeks gestation of pregnancy: Secondary | ICD-10-CM | POA: Diagnosis not present

## 2016-04-11 DIAGNOSIS — E1065 Type 1 diabetes mellitus with hyperglycemia: Secondary | ICD-10-CM | POA: Diagnosis not present

## 2016-04-11 DIAGNOSIS — E109 Type 1 diabetes mellitus without complications: Secondary | ICD-10-CM | POA: Diagnosis not present

## 2016-04-17 DIAGNOSIS — R509 Fever, unspecified: Secondary | ICD-10-CM | POA: Diagnosis not present

## 2016-04-17 DIAGNOSIS — J111 Influenza due to unidentified influenza virus with other respiratory manifestations: Secondary | ICD-10-CM | POA: Diagnosis not present

## 2016-04-30 DIAGNOSIS — Z331 Pregnant state, incidental: Secondary | ICD-10-CM | POA: Diagnosis not present

## 2016-04-30 DIAGNOSIS — Z4681 Encounter for fitting and adjustment of insulin pump: Secondary | ICD-10-CM | POA: Diagnosis not present

## 2016-04-30 DIAGNOSIS — E109 Type 1 diabetes mellitus without complications: Secondary | ICD-10-CM | POA: Diagnosis not present

## 2016-04-30 DIAGNOSIS — Z6831 Body mass index (BMI) 31.0-31.9, adult: Secondary | ICD-10-CM | POA: Diagnosis not present

## 2016-05-05 DIAGNOSIS — O368330 Maternal care for abnormalities of the fetal heart rate or rhythm, third trimester, not applicable or unspecified: Secondary | ICD-10-CM | POA: Diagnosis not present

## 2016-05-05 DIAGNOSIS — Z3A32 32 weeks gestation of pregnancy: Secondary | ICD-10-CM | POA: Diagnosis not present

## 2016-05-08 DIAGNOSIS — O24913 Unspecified diabetes mellitus in pregnancy, third trimester: Secondary | ICD-10-CM | POA: Diagnosis not present

## 2016-05-08 DIAGNOSIS — O9989 Other specified diseases and conditions complicating pregnancy, childbirth and the puerperium: Secondary | ICD-10-CM | POA: Diagnosis not present

## 2016-05-08 DIAGNOSIS — Z3A33 33 weeks gestation of pregnancy: Secondary | ICD-10-CM | POA: Diagnosis not present

## 2016-05-12 DIAGNOSIS — Z3A33 33 weeks gestation of pregnancy: Secondary | ICD-10-CM | POA: Diagnosis not present

## 2016-05-12 DIAGNOSIS — O24113 Pre-existing diabetes mellitus, type 2, in pregnancy, third trimester: Secondary | ICD-10-CM | POA: Diagnosis not present

## 2016-05-15 ENCOUNTER — Inpatient Hospital Stay (HOSPITAL_COMMUNITY)
Admission: AD | Admit: 2016-05-15 | Discharge: 2016-05-15 | Disposition: A | Discharge status: Home / Self Care | Payer: BLUE CROSS/BLUE SHIELD | Source: Ambulatory Visit | Attending: Obstetrics and Gynecology | Admitting: Obstetrics and Gynecology

## 2016-05-15 ENCOUNTER — Inpatient Hospital Stay (HOSPITAL_COMMUNITY): Payer: BLUE CROSS/BLUE SHIELD

## 2016-05-15 ENCOUNTER — Encounter (HOSPITAL_COMMUNITY): Payer: Self-pay

## 2016-05-15 DIAGNOSIS — O288 Other abnormal findings on antenatal screening of mother: Secondary | ICD-10-CM | POA: Insufficient documentation

## 2016-05-15 DIAGNOSIS — O24113 Pre-existing diabetes mellitus, type 2, in pregnancy, third trimester: Secondary | ICD-10-CM | POA: Diagnosis not present

## 2016-05-15 DIAGNOSIS — O9989 Other specified diseases and conditions complicating pregnancy, childbirth and the puerperium: Secondary | ICD-10-CM | POA: Diagnosis not present

## 2016-05-15 DIAGNOSIS — Z3A34 34 weeks gestation of pregnancy: Secondary | ICD-10-CM | POA: Insufficient documentation

## 2016-05-15 NOTE — MAU Provider Note (Signed)
History     CSN: [id number]  Arrival date and time: 05/15/16 1535   First Provider Initiated Contact with Patient 05/15/16 1646      Chief Complaint  Patient presents with  . Non-stress Test    deceleration in office today   HPI [name] is a 31 y.o. G4P0030 at [redacted]w[redacted]d who presents from the office for fetal monitoring & BPP. Patient was in office today for NST & had a 2 minute deceleration. Patient denies abdominal pain, vaginal bleeding, or LOF. Positive fetal movement.   OB History    Gravida Para Term Preterm AB Living   4       3     SAB TAB Ectopic Multiple Live Births   3              Past Medical History:  Diagnosis Date  . Diabetes mellitus without complication (HCC)    Type 1- on insulin pump    Past Surgical History:  Procedure Laterality Date  . DILATION AND EVACUATION N/A 03/16/2014   Procedure: DILATATION AND EVACUATION;  Surgeon: Lenoard Aden, MD;  Location: [facility];  Service: Gynecology;  Laterality: N/A;    Family History  Problem Relation Age of Onset  . Hypertension Mother   . Hypertension Father   . Diabetes Maternal Grandmother   . Hypertension Maternal Grandmother   . Diabetes Maternal Grandfather   . Hypertension Maternal Grandfather   . Heart disease Paternal Grandmother   . Hypertension Paternal Grandmother   . Heart disease Paternal Grandfather   . Hypertension Paternal Grandfather     Social History  Substance Use Topics  . Smoking status: Never Smoker  . Smokeless tobacco: Never Used  . Alcohol use No    Allergies: No Known Allergies  Prescriptions Prior to Admission  Medication Sig Dispense Refill Last Dose  . aspirin EC 81 MG tablet Take 81 mg by mouth daily.   05/15/2016 at Unknown time  . folic acid (FOLVITE) 1 MG tablet Take 1 mg by mouth daily.   05/15/2016 at Unknown time  . Prenatal Vit-Fe Fumarate-FA (PRENATAL MULTIVITAMIN) TABS tablet Take 1 tablet by mouth daily at 12 noon.   05/15/2016 at Unknown time  .  Insulin Human (INSULIN PUMP) SOLN Inject into the skin continuous. Novolog       Review of Systems  Gastrointestinal: Negative.   Genitourinary: Negative.    Physical Exam   Blood pressure 128/75, pulse 80, temperature 98.2 F (36.8 C), temperature source Oral, resp. rate 18, SpO2 100 %.  Physical Exam  Nursing note and vitals reviewed. Constitutional: She is oriented to person, place, and time. She appears well-developed and well-nourished. No distress.  HENT:  Head: Normocephalic and atraumatic.  Eyes: Conjunctivae are normal. Right eye exhibits no discharge. Left eye exhibits no discharge. No scleral icterus.  Neck: Normal range of motion.  Respiratory: Effort normal. No respiratory distress.  Neurological: She is alert and oriented to person, place, and time.  Skin: Skin is warm and dry. She is not diaphoretic.  Psychiatric: She has a normal mood and affect. Her behavior is normal. Judgment and thought content normal.   Fetal Tracing:  Baseline: 135 Variability: moderate Accelerations: 15x15 Decelerations: none  Toco: irr ctx MAU Course  Procedures No results found for this or any previous visit (from the past 48 hour(s)).  [place] Mfm Fetal Bpp Wo Non Stress  Result Date: 05/15/2016 ----------------------------------------------------------------------  OBSTETRICS REPORT                      (  Signed Final 05/15/2016 05:51 pm) ---------------------------------------------------------------------- Patient Info  ID #:       [id number]                         D.O.B.:   1985/10/17 (30 yrs)  Name:       [name]                 Visit Date:  05/15/2016 04:52 pm ---------------------------------------------------------------------- Performed By  Performed By:     Lestine Mount RDMS      Ref. Address:     [address]; [address]  Attending:        Clarene Critchley [place]car        Location:         [facility]                     MD  Referred By:      Harold Hedge                    MD ---------------------------------------------------------------------- Orders   #  Description                                 Code   1  [place] MFM FETAL BPP WO NON STRESS              [phone]  ----------------------------------------------------------------------   #  Ordered By               Order #        Accession #    Episode #   1  Harold Hedge            [id number]      [id number]     [id number]  ---------------------------------------------------------------------- Indications   [redacted] weeks gestation of pregnancy                Z3A.34   Non-reactive NST                               O28.9  ---------------------------------------------------------------------- OB History  Gravidity:    4         Term:   0        Prem:   0        SAB:   3  TOP:          0       Ectopic:  0        Living: 0 ---------------------------------------------------------------------- Fetal Evaluation  Num Of Fetuses:     1  Fetal Heart         158  Rate(bpm):  Cardiac Activity:   Observed  Presentation:       Vertex  Placenta:           Anterior, above cervical os  Amniotic Fluid  AFI FV:      Subjectively within normal limits  AFI Sum(cm)     %Tile  Largest Pocket(cm)  16.32           59          5.64  RUQ(cm)       RLQ(cm)       LUQ(cm)        LLQ(cm)  5.64          2.9           5.46           2.32 ---------------------------------------------------------------------- Biophysical Evaluation  Amniotic F.V:   Within normal limits       F. Tone:        Observed  F. Movement:    Observed                   Score:          8/8  F. Breathing:   Observed ---------------------------------------------------------------------- Gestational Age  Clinical EDD:  34w 0d                                        EDD:   06/26/16  Best:          34w 0d    Det. By:   Clinical EDD             EDD:   06/26/16 ---------------------------------------------------------------------- Impression  Single  IUP at 34w 0d  Cephalic presentation  BPP 8/8  Normal amniotic fluid volume ---------------------------------------------------------------------- Recommendations  Follow-up ultrasounds as clinically indicated. ----------------------------------------------------------------------                Candis Shine, MD Electronically Signed Final Report   05/15/2016 05:51 pm ----------------------------------------------------------------------    MDM Reactive FHT in MAU x 1 hour BPP 8/8 with normal AFI S/w Dr. Henderson Cloud. Ok to discharge home  Assessment and Plan  A; 1. [redacted] weeks gestation of pregnancy   2. Non-reactive NST (non-stress test)    P: Discharge home Discussed reasons to return to MAU Keep f/u with OB  Judeth Horn 05/15/2016, 4:45 PM

## 2016-05-15 NOTE — MAU Note (Signed)
Pt was in office for scheduled NST, she had a 2 min decel on monitor and was sent to MAU for extended monitoring and BPP

## 2016-05-15 NOTE — Discharge Instructions (Signed)
Fetal Movement Counts  Patient Name: ________________________________________________ Patient Due Date: ____________________  What is a fetal movement count?  A fetal movement count is the number of times that you feel your baby move during a certain amount of time. This may also be called a fetal kick count. A fetal movement count is recommended for every pregnant woman. You may be asked to start counting fetal movements as early as week 28 of your pregnancy.  Pay attention to when your baby is most active. You may notice your baby's sleep and wake cycles. You may also notice things that make your baby move more. You should do a fetal movement count:  · When your baby is normally most active.  · At the same time each day.    A good time to count movements is while you are resting, after having something to eat and drink.  How do I count fetal movements?  1. Find a quiet, comfortable area. Sit, or lie down on your side.  2. Write down the date, the start time and stop time, and the number of movements that you felt between those two times. Take this information with you to your health care visits.  3. For 2 hours, count kicks, flutters, swishes, rolls, and jabs. You should feel at least 10 movements during 2 hours.  4. You may stop counting after you have felt 10 movements.  5. If you do not feel 10 movements in 2 hours, have something to eat and drink. Then, keep resting and counting for 1 hour. If you feel at least 4 movements during that hour, you may stop counting.  Contact a health care provider if:  · You feel fewer than 4 movements in 2 hours.  · Your baby is not moving like he or she usually does.  Date: ____________ Start time: ____________ Stop time: ____________ Movements: ____________  Date: ____________ Start time: ____________ Stop time: ____________ Movements: ____________  Date: ____________ Start time: ____________ Stop time: ____________ Movements: ____________  Date: ____________ Start time:  ____________ Stop time: ____________ Movements: ____________  Date: ____________ Start time: ____________ Stop time: ____________ Movements: ____________  Date: ____________ Start time: ____________ Stop time: ____________ Movements: ____________  Date: ____________ Start time: ____________ Stop time: ____________ Movements: ____________  Date: ____________ Start time: ____________ Stop time: ____________ Movements: ____________  Date: ____________ Start time: ____________ Stop time: ____________ Movements: ____________  This information is not intended to replace advice given to you by your health care provider. Make sure you discuss any questions you have with your health care provider.  Document Released: 02/19/2006 Document Revised: 09/19/2015 Document Reviewed: 03/01/2015  Elsevier Interactive Patient Education © 2017 [employer]

## 2016-05-19 DIAGNOSIS — Z3A34 34 weeks gestation of pregnancy: Secondary | ICD-10-CM | POA: Diagnosis not present

## 2016-05-19 DIAGNOSIS — O9989 Other specified diseases and conditions complicating pregnancy, childbirth and the puerperium: Secondary | ICD-10-CM | POA: Diagnosis not present

## 2016-05-20 DIAGNOSIS — D2239 Melanocytic nevi of other parts of face: Secondary | ICD-10-CM | POA: Diagnosis not present

## 2016-05-20 DIAGNOSIS — D224 Melanocytic nevi of scalp and neck: Secondary | ICD-10-CM | POA: Diagnosis not present

## 2016-05-22 DIAGNOSIS — Z3480 Encounter for supervision of other normal pregnancy, unspecified trimester: Secondary | ICD-10-CM | POA: Diagnosis not present

## 2016-05-22 DIAGNOSIS — Z3A35 35 weeks gestation of pregnancy: Secondary | ICD-10-CM | POA: Diagnosis not present

## 2016-05-22 DIAGNOSIS — O24113 Pre-existing diabetes mellitus, type 2, in pregnancy, third trimester: Secondary | ICD-10-CM | POA: Diagnosis not present

## 2016-05-22 DIAGNOSIS — Z36 Encounter for antenatal screening for chromosomal anomalies: Secondary | ICD-10-CM | POA: Diagnosis not present

## 2016-05-26 DIAGNOSIS — O24813 Other pre-existing diabetes mellitus in pregnancy, third trimester: Secondary | ICD-10-CM | POA: Diagnosis not present

## 2016-05-26 DIAGNOSIS — Z3A35 35 weeks gestation of pregnancy: Secondary | ICD-10-CM | POA: Diagnosis not present

## 2016-05-28 DIAGNOSIS — E109 Type 1 diabetes mellitus without complications: Secondary | ICD-10-CM | POA: Diagnosis not present

## 2016-05-28 DIAGNOSIS — Z331 Pregnant state, incidental: Secondary | ICD-10-CM | POA: Diagnosis not present

## 2016-05-28 DIAGNOSIS — Z4681 Encounter for fitting and adjustment of insulin pump: Secondary | ICD-10-CM | POA: Diagnosis not present

## 2016-05-29 DIAGNOSIS — O9989 Other specified diseases and conditions complicating pregnancy, childbirth and the puerperium: Secondary | ICD-10-CM | POA: Diagnosis not present

## 2016-05-29 DIAGNOSIS — Z3A36 36 weeks gestation of pregnancy: Secondary | ICD-10-CM | POA: Diagnosis not present

## 2016-06-02 DIAGNOSIS — Z3A36 36 weeks gestation of pregnancy: Secondary | ICD-10-CM | POA: Diagnosis not present

## 2016-06-02 DIAGNOSIS — O9989 Other specified diseases and conditions complicating pregnancy, childbirth and the puerperium: Secondary | ICD-10-CM | POA: Diagnosis not present

## 2016-06-03 DIAGNOSIS — Z1389 Encounter for screening for other disorder: Secondary | ICD-10-CM | POA: Diagnosis not present

## 2016-06-03 DIAGNOSIS — Z331 Pregnant state, incidental: Secondary | ICD-10-CM | POA: Diagnosis not present

## 2016-06-03 DIAGNOSIS — E784 Other hyperlipidemia: Secondary | ICD-10-CM | POA: Diagnosis not present

## 2016-06-03 DIAGNOSIS — E109 Type 1 diabetes mellitus without complications: Secondary | ICD-10-CM | POA: Diagnosis not present

## 2016-06-03 DIAGNOSIS — E048 Other specified nontoxic goiter: Secondary | ICD-10-CM | POA: Diagnosis not present

## 2016-06-09 DIAGNOSIS — O9989 Other specified diseases and conditions complicating pregnancy, childbirth and the puerperium: Secondary | ICD-10-CM | POA: Diagnosis not present

## 2016-06-12 DIAGNOSIS — O9989 Other specified diseases and conditions complicating pregnancy, childbirth and the puerperium: Secondary | ICD-10-CM | POA: Diagnosis not present

## 2016-06-12 DIAGNOSIS — Z3A38 38 weeks gestation of pregnancy: Secondary | ICD-10-CM | POA: Diagnosis not present

## 2016-06-16 DIAGNOSIS — O24813 Other pre-existing diabetes mellitus in pregnancy, third trimester: Secondary | ICD-10-CM | POA: Diagnosis not present

## 2016-06-16 DIAGNOSIS — Z3A38 38 weeks gestation of pregnancy: Secondary | ICD-10-CM | POA: Diagnosis not present

## 2016-06-16 DIAGNOSIS — E1065 Type 1 diabetes mellitus with hyperglycemia: Secondary | ICD-10-CM | POA: Diagnosis not present

## 2016-06-16 DIAGNOSIS — E109 Type 1 diabetes mellitus without complications: Secondary | ICD-10-CM | POA: Diagnosis not present

## 2016-06-17 ENCOUNTER — Encounter (HOSPITAL_COMMUNITY): Payer: Self-pay | Admitting: *Deleted

## 2016-06-17 ENCOUNTER — Telehealth (HOSPITAL_COMMUNITY): Payer: Self-pay | Admitting: *Deleted

## 2016-06-17 LAB — OB RESULTS CONSOLE GBS: GBS: NEGATIVE

## 2016-06-17 NOTE — Telephone Encounter (Signed)
Preadmission screen  

## 2016-06-18 DIAGNOSIS — Z4681 Encounter for fitting and adjustment of insulin pump: Secondary | ICD-10-CM | POA: Diagnosis not present

## 2016-06-18 DIAGNOSIS — Z331 Pregnant state, incidental: Secondary | ICD-10-CM | POA: Diagnosis not present

## 2016-06-18 DIAGNOSIS — Z6832 Body mass index (BMI) 32.0-32.9, adult: Secondary | ICD-10-CM | POA: Diagnosis not present

## 2016-06-18 DIAGNOSIS — E109 Type 1 diabetes mellitus without complications: Secondary | ICD-10-CM | POA: Diagnosis not present

## 2016-06-19 DIAGNOSIS — Z3A39 39 weeks gestation of pregnancy: Secondary | ICD-10-CM | POA: Diagnosis not present

## 2016-06-19 DIAGNOSIS — O368330 Maternal care for abnormalities of the fetal heart rate or rhythm, third trimester, not applicable or unspecified: Secondary | ICD-10-CM | POA: Diagnosis not present

## 2016-06-20 ENCOUNTER — Inpatient Hospital Stay (HOSPITAL_COMMUNITY)
Admission: RE | Admit: 2016-06-20 | Discharge: 2016-06-22 | DRG: 774 | Disposition: A | Discharge status: Home / Self Care | Payer: BLUE CROSS/BLUE SHIELD | Source: Ambulatory Visit | Attending: Obstetrics and Gynecology | Admitting: Obstetrics and Gynecology

## 2016-06-20 ENCOUNTER — Encounter (HOSPITAL_COMMUNITY): Payer: Self-pay

## 2016-06-20 VITALS — BP 119/71 | HR 66 | Temp 98.5°F | Resp 18 | Ht 65.0 in | Wt 208.0 lb

## 2016-06-20 DIAGNOSIS — Z794 Long term (current) use of insulin: Secondary | ICD-10-CM | POA: Diagnosis not present

## 2016-06-20 DIAGNOSIS — E109 Type 1 diabetes mellitus without complications: Secondary | ICD-10-CM | POA: Diagnosis not present

## 2016-06-20 DIAGNOSIS — Z9641 Presence of insulin pump (external) (internal): Secondary | ICD-10-CM | POA: Diagnosis present

## 2016-06-20 DIAGNOSIS — O2402 Pre-existing diabetes mellitus, type 1, in childbirth: Principal | ICD-10-CM | POA: Diagnosis present

## 2016-06-20 DIAGNOSIS — Z3A39 39 weeks gestation of pregnancy: Secondary | ICD-10-CM | POA: Diagnosis not present

## 2016-06-20 DIAGNOSIS — Z3490 Encounter for supervision of normal pregnancy, unspecified, unspecified trimester: Secondary | ICD-10-CM

## 2016-06-20 LAB — CBC
HEMATOCRIT: 39.6 % (ref 36.0–46.0)
Hemoglobin: 14.3 g/dL (ref 12.0–15.0)
MCH: 32.9 pg (ref 26.0–34.0)
MCHC: 36.1 g/dL — AB (ref 30.0–36.0)
MCV: 91.2 fL (ref 78.0–100.0)
Platelets: 159 10*3/uL (ref 150–400)
RBC: 4.34 MIL/uL (ref 3.87–5.11)
RDW: 13.5 % (ref 11.5–15.5)
WBC: 6.5 10*3/uL (ref 4.0–10.5)

## 2016-06-20 LAB — GLUCOSE, CAPILLARY
GLUCOSE-CAPILLARY: 50 mg/dL — AB (ref 65–99)
GLUCOSE-CAPILLARY: 110 mg/dL — AB (ref 65–99)
GLUCOSE-CAPILLARY: 123 mg/dL — AB (ref 65–99)
GLUCOSE-CAPILLARY: 150 mg/dL — AB (ref 65–99)
GLUCOSE-CAPILLARY: 117 mg/dL — AB (ref 65–99)
GLUCOSE-CAPILLARY: 108 mg/dL — AB (ref 65–99)
GLUCOSE-CAPILLARY: 115 mg/dL — AB (ref 65–99)
GLUCOSE-CAPILLARY: 140 mg/dL — AB (ref 65–99)
GLUCOSE-CAPILLARY: 141 mg/dL — AB (ref 65–99)
GLUCOSE-CAPILLARY: 107 mg/dL — AB (ref 65–99)
Glucose-Capillary: 89 mg/dL (ref 65–99)
Glucose-Capillary: 49 mg/dL — ABNORMAL LOW (ref 65–99)
Glucose-Capillary: 90 mg/dL (ref 65–99)
Glucose-Capillary: 162 mg/dL — ABNORMAL HIGH (ref 65–99)
Glucose-Capillary: 117 mg/dL — ABNORMAL HIGH (ref 65–99)
Glucose-Capillary: 110 mg/dL — ABNORMAL HIGH (ref 65–99)
Glucose-Capillary: 104 mg/dL — ABNORMAL HIGH (ref 65–99)
Glucose-Capillary: 100 mg/dL — ABNORMAL HIGH (ref 65–99)

## 2016-06-20 LAB — TYPE AND SCREEN
ABO/RH(D): B POS
Antibody Screen: NEGATIVE

## 2016-06-20 MED ORDER — OXYCODONE-ACETAMINOPHEN 5-325 MG PO TABS: 1.0000 | ORAL_TABLET | ORAL | Status: DC | PRN

## 2016-06-20 MED ORDER — DEXTROSE 50 % IV SOLN: 25.0000 mL | INTRAVENOUS | Status: DC | PRN

## 2016-06-20 MED ORDER — MEASLES, MUMPS & RUBELLA VAC ~~LOC~~ INJ: 0.5000 mL | INJECTION | Freq: Once | SUBCUTANEOUS | Status: DC

## 2016-06-20 MED ORDER — LIDOCAINE HCL (PF) 1 % IJ SOLN
30.0000 mL | INTRAMUSCULAR | Status: AC | PRN
  Administered 2016-06-20 (×2): 30 mL via SUBCUTANEOUS
  Filled 2016-06-20 (×2): qty 30

## 2016-06-20 MED ORDER — INSULIN PUMP
Freq: Three times a day (TID) | SUBCUTANEOUS | Status: DC
  Administered 2016-06-20: 1 via SUBCUTANEOUS
  Administered 2016-06-21 – 2016-06-22 (×4): via SUBCUTANEOUS
  Filled 2016-06-20: qty 1

## 2016-06-20 MED ORDER — DIBUCAINE 1 % RE OINT: 1.0000 "application " | TOPICAL_OINTMENT | RECTAL | Status: DC | PRN

## 2016-06-20 MED ORDER — TERBUTALINE SULFATE 1 MG/ML IJ SOLN
0.2500 mg | Freq: Once | INTRAMUSCULAR | Status: DC | PRN
  Filled 2016-06-20: qty 1

## 2016-06-20 MED ORDER — SODIUM CHLORIDE 0.9 % IV SOLN
INTRAVENOUS | Status: DC
  Administered 2016-06-20: 0.6 [IU]/h via INTRAVENOUS
  Filled 2016-06-20: qty 1

## 2016-06-20 MED ORDER — ONDANSETRON HCL 4 MG/2ML IJ SOLN: 4.0000 mg | INTRAMUSCULAR | Status: DC | PRN

## 2016-06-20 MED ORDER — SENNOSIDES-DOCUSATE SODIUM 8.6-50 MG PO TABS
2.0000 | ORAL_TABLET | ORAL | Status: DC
  Administered 2016-06-20 – 2016-06-22 (×2): 2 via ORAL
  Filled 2016-06-20 (×2): qty 2

## 2016-06-20 MED ORDER — SODIUM CHLORIDE 0.45 % IV SOLN
INTRAVENOUS | Status: DC
  Filled 2016-06-20: qty 1000

## 2016-06-20 MED ORDER — OXYCODONE-ACETAMINOPHEN 5-325 MG PO TABS: 2.0000 | ORAL_TABLET | ORAL | Status: DC | PRN

## 2016-06-20 MED ORDER — OXYTOCIN 40 UNITS IN LACTATED RINGERS INFUSION - SIMPLE MED
1.0000 m[IU]/min | INTRAVENOUS | Status: DC
  Administered 2016-06-20: 2 m[IU]/min via INTRAVENOUS
  Filled 2016-06-20: qty 1000

## 2016-06-20 MED ORDER — ACETAMINOPHEN 325 MG PO TABS: 650.0000 mg | ORAL_TABLET | ORAL | Status: DC | PRN

## 2016-06-20 MED ORDER — WITCH HAZEL-GLYCERIN EX PADS: 1.0000 "application " | MEDICATED_PAD | CUTANEOUS | Status: DC | PRN

## 2016-06-20 MED ORDER — COCONUT OIL OIL
1.0000 "application " | TOPICAL_OIL | Status: DC | PRN
  Administered 2016-06-21: 1 via TOPICAL
  Filled 2016-06-20: qty 120

## 2016-06-20 MED ORDER — DIPHENHYDRAMINE HCL 50 MG/ML IJ SOLN: 12.5000 mg | INTRAMUSCULAR | Status: DC | PRN

## 2016-06-20 MED ORDER — BENZOCAINE-MENTHOL 20-0.5 % EX AERO
1.0000 "application " | INHALATION_SPRAY | CUTANEOUS | Status: DC | PRN
  Administered 2016-06-21: 1 via TOPICAL

## 2016-06-20 MED ORDER — EPHEDRINE 5 MG/ML INJ
10.0000 mg | INTRAVENOUS | Status: DC | PRN
  Filled 2016-06-20: qty 2

## 2016-06-20 MED ORDER — OXYTOCIN BOLUS FROM INFUSION
500.0000 mL | Freq: Once | INTRAVENOUS | Status: AC
  Administered 2016-06-20: 500 mL via INTRAVENOUS

## 2016-06-20 MED ORDER — PHENYLEPHRINE 40 MCG/ML (10ML) SYRINGE FOR IV PUSH (FOR BLOOD PRESSURE SUPPORT)
80.0000 ug | PREFILLED_SYRINGE | INTRAVENOUS | Status: DC | PRN
  Filled 2016-06-20: qty 5

## 2016-06-20 MED ORDER — OXYTOCIN 40 UNITS IN LACTATED RINGERS INFUSION - SIMPLE MED: 2.5000 [IU]/h | INTRAVENOUS | Status: DC

## 2016-06-20 MED ORDER — FLEET ENEMA 7-19 GM/118ML RE ENEM: 1.0000 | ENEMA | Freq: Every day | RECTAL | Status: DC | PRN

## 2016-06-20 MED ORDER — ONDANSETRON HCL 4 MG PO TABS: 4.0000 mg | ORAL_TABLET | ORAL | Status: DC | PRN

## 2016-06-20 MED ORDER — DEXTROSE-NACL 5-0.45 % IV SOLN
INTRAVENOUS | Status: DC
  Administered 2016-06-20 (×2): via INTRAVENOUS
  Filled 2016-06-20: qty 1000

## 2016-06-20 MED ORDER — INSULIN PUMP
SUBCUTANEOUS | Status: DC
  Administered 2016-06-20: 1 via SUBCUTANEOUS
  Filled 2016-06-20: qty 1

## 2016-06-20 MED ORDER — SIMETHICONE 80 MG PO CHEW
80.0000 mg | CHEWABLE_TABLET | ORAL | Status: DC | PRN
Start: 2016-06-20 — End: 2016-06-22

## 2016-06-20 MED ORDER — INSULIN REGULAR BOLUS VIA INFUSION
0.0000 [IU] | Freq: Three times a day (TID) | INTRAVENOUS | Status: DC
  Filled 2016-06-20: qty 10

## 2016-06-20 MED ORDER — OXYTOCIN 40 UNITS IN LACTATED RINGERS INFUSION - SIMPLE MED: 1.0000 m[IU]/min | INTRAVENOUS | Status: DC

## 2016-06-20 MED ORDER — ACETAMINOPHEN 325 MG PO TABS
650.0000 mg | ORAL_TABLET | ORAL | Status: DC | PRN
Start: 2016-06-20 — End: 2016-06-22

## 2016-06-20 MED ORDER — LACTATED RINGERS IV SOLN: 500.0000 mL | Freq: Once | INTRAVENOUS | Status: DC

## 2016-06-20 MED ORDER — ZOLPIDEM TARTRATE 5 MG PO TABS: 5.0000 mg | ORAL_TABLET | Freq: Every evening | ORAL | Status: DC | PRN

## 2016-06-20 MED ORDER — FENTANYL 2.5 MCG/ML BUPIVACAINE 1/10 % EPIDURAL INFUSION (WH - ANES): 14.0000 mL/h | INTRAMUSCULAR | Status: DC | PRN

## 2016-06-20 MED ORDER — SOD CITRATE-CITRIC ACID 500-334 MG/5ML PO SOLN: 30.0000 mL | ORAL | Status: DC | PRN

## 2016-06-20 MED ORDER — MEDROXYPROGESTERONE ACETATE 150 MG/ML IM SUSP: 150.0000 mg | INTRAMUSCULAR | Status: DC | PRN

## 2016-06-20 MED ORDER — LACTATED RINGERS IV SOLN: 500.0000 mL | INTRAVENOUS | Status: DC | PRN

## 2016-06-20 MED ORDER — MISOPROSTOL 25 MCG QUARTER TABLET
25.0000 ug | ORAL_TABLET | ORAL | Status: DC | PRN
  Administered 2016-06-20: 25 ug via VAGINAL
  Filled 2016-06-20 (×2): qty 1

## 2016-06-20 MED ORDER — TETANUS-DIPHTH-ACELL PERTUSSIS 5-2.5-18.5 LF-MCG/0.5 IM SUSP: 0.5000 mL | Freq: Once | INTRAMUSCULAR | Status: DC

## 2016-06-20 MED ORDER — LACTATED RINGERS IV SOLN
INTRAVENOUS | Status: DC
  Administered 2016-06-20 (×2): via INTRAVENOUS

## 2016-06-20 MED ORDER — PRENATAL MULTIVITAMIN CH
1.0000 | ORAL_TABLET | Freq: Every day | ORAL | Status: DC
  Administered 2016-06-21 – 2016-06-22 (×2): 1 via ORAL
  Filled 2016-06-20 (×2): qty 1

## 2016-06-20 MED ORDER — DIPHENHYDRAMINE HCL 25 MG PO CAPS
25.0000 mg | ORAL_CAPSULE | Freq: Four times a day (QID) | ORAL | Status: DC | PRN
Start: 2016-06-20 — End: 2016-06-22

## 2016-06-20 MED ORDER — ONDANSETRON HCL 4 MG/2ML IJ SOLN
4.0000 mg | Freq: Four times a day (QID) | INTRAMUSCULAR | Status: DC | PRN
  Administered 2016-06-20: 4 mg via INTRAVENOUS
  Filled 2016-06-20: qty 2

## 2016-06-20 MED ORDER — IBUPROFEN 600 MG PO TABS
600.0000 mg | ORAL_TABLET | Freq: Four times a day (QID) | ORAL | Status: DC
  Administered 2016-06-20 – 2016-06-22 (×8): 600 mg via ORAL
  Filled 2016-06-20 (×8): qty 1

## 2016-06-20 MED ORDER — BUTORPHANOL TARTRATE 1 MG/ML IJ SOLN
2.0000 mg | Freq: Once | INTRAMUSCULAR | Status: AC
  Administered 2016-06-20: 2 mg via INTRAVENOUS
  Filled 2016-06-20: qty 2

## 2016-06-20 MED ORDER — BISACODYL 10 MG RE SUPP: 10.0000 mg | Freq: Every day | RECTAL | Status: DC | PRN

## 2016-06-20 NOTE — Progress Notes (Signed)
Has received cytotec X 1, now 2/75/vtx/-2>>>AROM>.cleear, vtx well applied

## 2016-06-20 NOTE — H&P (Signed)
[name] is a 31 y.o. female presenting for IOL, pt w/ IDDm and insulin pump. OB History    Gravida Para Term Preterm AB Living   4       3     SAB TAB Ectopic Multiple Live Births   3             Past Medical History:  Diagnosis Date  . Diabetes mellitus without complication (HCC)    Type 1- on insulin pump   Past Surgical History:  Procedure Laterality Date  . DILATION AND EVACUATION N/A 03/16/2014   Procedure: DILATATION AND EVACUATION;  Surgeon: Lenoard Adenichard J Taavon, MD;  Location: [facility];  Service: Gynecology;  Laterality: N/A;   Family History: family history includes Cancer in her maternal grandmother; Depression in her maternal grandmother, mother, and paternal aunt; Diabetes in her maternal grandfather and maternal grandmother; Heart disease in her father, paternal grandfather, paternal grandmother, and paternal uncle; Hypertension in her father, maternal grandfather, maternal grandmother, mother, paternal grandfather, paternal grandmother, and sister; Irritable bowel syndrome in her mother; Seizures in her maternal uncle; Skin cancer in her father, mother, and sister; Thyroid disease in her father. Social History:  reports that she has never smoked. She has never used smokeless tobacco. She reports that she does not drink alcohol or use drugs.     Maternal Diabetes: Yes:  Diabetes Type:  Pre-pregnancy, Insulin/Medication controlled Genetic Screening: Normal Maternal Ultrasounds/Referrals: Normal Fetal Ultrasounds or other Referrals:  None Maternal Substance Abuse:  No Significant Maternal Medications:  None Significant Maternal Lab Results:  None Other Comments:  None  ROS History Dilation: 2 Effacement (%): 80 Station: -2 Exam by:: Dr Marcelle OverlieHolland Blood pressure 136/88, pulse 87, temperature 98.3 F (36.8 C), temperature source Oral, resp. rate 18, height 5\' 5"  (1.651 [name]), weight 94.3 kg (208 lb). Exam Physical Exam  Constitutional: She is oriented to person, place,  and time. She appears well-developed and well-nourished.  HENT:  Head: Normocephalic and atraumatic.  Neck: Normal range of motion. Neck supple.  Cardiovascular: Normal rate and regular rhythm.   Respiratory: Effort normal and breath sounds normal.  GI:  Term FH, FHR 138  Genitourinary:  Genitourinary Comments: 2/50/vtx  Musculoskeletal: Normal range of motion.  Neurological: She is alert and oriented to person, place, and time.    Prenatal labs: ABO, Rh: --/--/B POS (05/18 0110) Antibody: NEG (05/18 0110) Rubella:   RPR: Nonreactive (10/19 0000)  HBsAg: Negative (10/19 0000)  HIV: Non-reactive (10/19 0000)  GBS: Negative (05/15 0000)   Assessment/Plan: 5387w1d IDDM, for IOL   [name] 06/20/2016, 4:19 AM

## 2016-06-20 NOTE — Progress Notes (Signed)
Pt states she is feeling better after Juice and twin pop. RN will continue to monitor.

## 2016-06-20 NOTE — Progress Notes (Signed)
Will D/C insulin pump and follow glucose stabilizer protocol

## 2016-06-20 NOTE — Anesthesia Pain Management Evaluation Note (Signed)
  CRNA Pain Management Visit Note  Patient: [name], 31 y.o., female  "Hello I am a member of the anesthesia team at [facility]. We have an anesthesia team available at all times to provide care throughout the hospital, including epidural management and anesthesia for C-section. I don't know your plan for the delivery whether it a natural birth, water birth, IV sedation, nitrous supplementation, doula or epidural, but we want to meet your pain goals."   1.Was your pain managed to your expectations on prior hospitalizations?   Yes   2.What is your expectation for pain management during this hospitalization?     IV pain meds  3.How can we help you reach that goal? Support prn  Record the patient's initial score and the patient's pain goal.   Pain: 7  Pain Goal: 7 [facility] wants you to be able to say your pain was always managed very well.  [facility],[name] 06/20/2016

## 2016-06-20 NOTE — Progress Notes (Signed)
Patient desires pain medications  BP (!) 136/92 Comment: Pt having a contraction during bp  Pulse 83   Temp 98.3 F (36.8 C) (Oral)   Resp 18   Ht 5\' 5"  (1.651 m)   Wt 94.3 kg (208 lb)   BMI 34.61 kg/m   FHR Category 1  Cervix is 90% 4.5 cm -2  Start glucose stabilizer Stadol Continue pitocin

## 2016-06-20 NOTE — Lactation Note (Addendum)
This note was copied from a baby's chart. Lactation Consultation Note  Patient Name: [name] WGNFA'OToday's Date: 06/20/2016 Reason for consult: Initial assessment   Initial assessment with first time mom of 1 hour old infant. Infant was code Apgar and is thought to have injured right clavicle. Mom is Type 1 Diabetic on Insulin. Mom c/o feeling dizzy/queasy, she checked her blood sugar, blood sugar was 154. LC asked RN to come into room, she did so to assess mom.   Infant STS with mom and has been feeding on and off on the football hold to the left breast. Assisted mom with pulling infant closer into the breast as infant mainly on tip of nipple. Spoon fed infant 1 ml colostrum via spoon. Mom with firm semi compressible breasts and areola with edema noted to areola. Nipples are semi flat and evert more with stimulation. Showed mom reverse pressure and enc her to use prior to latch. Mom was able to hand express small gtts colostrum that were spoon fed to infant.   Discussed with mom using the football hold on the left breast and the cross cradle on the right side to protect shoulder.   Then assisted mom in repositioning infant to right breast in the cross cradle hold. Infant latched on and off the breast with several suckling bursts. Infant with intermittent swallows. Mom reports no pain/pinching with feeding. Mom was able to determine when infant was latched shallowly and relatched infant as needed. Infant was still feeding when LC left room.   Enc mom to feed infant STS 8-12 x in 24 hours at first feeding cues. Enc mom to hand express before and after feeding. Enc mom to offer colostrum via spoon after BF. Showed mom how to hand express colostrum.   BF Resources Handout and LC Brochure given, mom informed of IP/OP Services, BF Support Groups and LC phone #. Mom reports she has a Medela PIS for home use. Enc mom to call out for feeding assistance as needed.   Discussed with mom that she may  require more calories with BF to maintain blood glucose levels. Mom and dad report they attended hospital BF classes. Parents without further questions/concerns at this time.    Maternal Data Formula Feeding for Exclusion: No Has patient been taught Hand Expression?: Yes Does the patient have breastfeeding experience prior to this delivery?: No  Feeding Feeding Type: Breast Fed Length of feed: 15 min  LATCH Score/Interventions Latch: Repeated attempts needed to sustain latch, nipple held in mouth throughout feeding, stimulation needed to elicit sucking reflex. Intervention(s): Adjust position;Assist with latch;Breast massage;Breast compression  Audible Swallowing: A few with stimulation Intervention(s): Skin to skin;Hand expression;Alternate breast massage  Type of Nipple: Everted at rest and after stimulation (semi flat, areolar edema)  Comfort (Breast/Nipple): Soft / non-tender     Hold (Positioning): Assistance needed to correctly position infant at breast and maintain latch. Intervention(s): Breastfeeding basics reviewed;Support Pillows;Position options;Skin to skin  LATCH Score: 7  Lactation Tools Discussed/Used WIC Program: No   Consult Status Consult Status: Follow-up Date: 06/21/16 Follow-up type: In-patient    [name] FloodSharon S Graylen [name] 06/20/2016, 5:38 PM

## 2016-06-20 NOTE — Progress Notes (Addendum)
Called to speak with patient over the phone but her husband reports that she received pain medication and she is asleep and "out of it" right now.  Noted per H&P patient has DM1 (absolutely insulin requiring as she makes no insulin at all).  Patient's insulin pump was removed due to hypoglycemia and patient was started on IV insulin/GlucoStablizer for inpatient glycemic control during labor.  Patient's husband states that his wife is followed by Dr. Evlyn KannerSouth and Wynne DustKathy Miller, CDE helps with insulin pump management. Patient's husband states they saw Wynne DustKathy Miller, CDE on Wednesday (06/18/16) and received insulin pump setting changes for post delivery.  Inquired about patient's ability to make insulin pump setting changes herself and patient's husband reports that his wife can make the changes in her insulin pump herself.  Briefly discussed changes with insulin needs post delivery and with breastfeeding. Stressed that patient will need to make insulin pump setting adjustments with her insulin pump before she restarts it after delivery.  Explained that patient will remain on the insulin drip/GlucoStabilizer during labor and once she is ready to reconnect her insulin pump the insulin drip will be continued for 1 hour (from time pump was restarted).  Patient's husband verbalized information discussed and reports that he has no questions at this time.   NURSING: Once MD is ready for patient to transition back to her insulin pump, MD will need to order the insulin pump order set. Please print off the Patient insulin pump contract and flow sheet. The insulin pump contract should be signed by the patient and then placed in the chart. The patient insulin pump flow sheet will be completed by the patient at the bedside and the RN caring for the patient will use the patient's flow sheet to [facility]. RN will need to complete the Nursing Insulin Pump Flowsheet at least once a shift. Patient will need to keep extra insulin  pump supplies at the bedside at all times. Please page Diabetes Coordinator for any questions or concerns.  Addendum 06/20/16@10 :54-Received return call [place] from Dr. Rinaldo CloudSouth's office regarding insulin pump settings post delivery. [place] reports that Wynne DustKathy Miller is out of the office today but she talked with Olegario MessierKathy and she reports that she placed post-delivery basal rates in the patient's insulin pump already under "Basal Pattern #1" but patient will need to change her insulin to carb ratio from 1:3 to 1:5 (1 unit covers 5 grams of carbs) and plan was to leave insulin sensitivity factor the same (1:30 - 1 unit drops glucose 30 mg/dl).  Thanks, Orlando PennerMarie Jeri Jeanbaptiste, RN, MSN, CDE Diabetes Coordinator Inpatient Diabetes Program [phone] (Team Pager from 8am to 5pm)

## 2016-06-21 ENCOUNTER — Encounter (HOSPITAL_COMMUNITY): Payer: Self-pay

## 2016-06-21 LAB — CBC
HCT: 32.9 % — ABNORMAL LOW (ref 36.0–46.0)
HEMOGLOBIN: 11.6 g/dL — AB (ref 12.0–15.0)
MCH: 32.7 pg (ref 26.0–34.0)
MCHC: 35.3 g/dL (ref 30.0–36.0)
MCV: 92.7 fL (ref 78.0–100.0)
Platelets: 150 10*3/uL (ref 150–400)
RBC: 3.55 MIL/uL — AB (ref 3.87–5.11)
RDW: 13.8 % (ref 11.5–15.5)
WBC: 12.7 10*3/uL — ABNORMAL HIGH (ref 4.0–10.5)

## 2016-06-21 LAB — GLUCOSE, CAPILLARY
GLUCOSE-CAPILLARY: 74 mg/dL (ref 65–99)
Glucose-Capillary: 40 mg/dL — CL (ref 65–99)
Glucose-Capillary: 55 mg/dL — ABNORMAL LOW (ref 65–99)
Glucose-Capillary: 74 mg/dL (ref 65–99)
Glucose-Capillary: 113 mg/dL — ABNORMAL HIGH (ref 65–99)

## 2016-06-21 LAB — RUBELLA SCREEN: RUBELLA: 8.68 {index} (ref >0.99)

## 2016-06-21 LAB — RPR: RPR: NONREACTIVE

## 2016-06-21 NOTE — Progress Notes (Signed)
Pt CBG 74 after 20 grams of Carbs. Pt eating lunch tray at this time.

## 2016-06-21 NOTE — Progress Notes (Signed)
Pt's CBG 47  Hypoglycemic order set started. Pt given 4oz apple juice, graham crackers and peanut butter for a total carb count of 45. Pt denies symptoms

## 2016-06-21 NOTE — Progress Notes (Signed)
Pt CBG 55 after 45 grams of carbs. Pt has lunch tray at bedside. Pt eating lunch at this time. Dr Vincente PoliGrewal aware.

## 2016-06-21 NOTE — Progress Notes (Signed)
Patient doing well.  On insulin pump and BS look fine  BP 113/64 (BP Location: Left Arm)   Pulse 76   Temp 98.5 F (36.9 C) (Oral)   Resp 18   Ht 5\' 5"  (1.651 m)   Wt 94.3 kg (208 lb)   Breastfeeding? Unknown   BMI 34.61 kg/m  Results for orders placed or performed during the hospital encounter of 06/20/16 (from the past 24 hour(s))  Glucose, capillary     Status: Abnormal   Collection Time: 06/20/16  8:52 AM  Result Value Ref Range   Glucose-Capillary 162 (H) 65 - 99 mg/dL  Glucose, capillary     Status: Abnormal   Collection Time: 06/20/16  9:54 AM  Result Value Ref Range   Glucose-Capillary 150 (H) 65 - 99 mg/dL  Glucose, capillary     Status: Abnormal   Collection Time: 06/20/16 10:58 AM  Result Value Ref Range   Glucose-Capillary 117 (H) 65 - 99 mg/dL  Glucose, capillary     Status: Abnormal   Collection Time: 06/20/16 11:56 AM  Result Value Ref Range   Glucose-Capillary 117 (H) 65 - 99 mg/dL  Glucose, capillary     Status: Abnormal   Collection Time: 06/20/16  1:02 PM  Result Value Ref Range   Glucose-Capillary 110 (H) 65 - 99 mg/dL  Glucose, capillary     Status: Abnormal   Collection Time: 06/20/16  2:13 PM  Result Value Ref Range   Glucose-Capillary 104 (H) 65 - 99 mg/dL  Glucose, capillary     Status: Abnormal   Collection Time: 06/20/16  3:15 PM  Result Value Ref Range   Glucose-Capillary 108 (H) 65 - 99 mg/dL  Glucose, capillary     Status: Abnormal   Collection Time: 06/20/16  4:22 PM  Result Value Ref Range   Glucose-Capillary 115 (H) 65 - 99 mg/dL  Glucose, capillary     Status: Abnormal   Collection Time: 06/20/16  5:45 PM  Result Value Ref Range   Glucose-Capillary 140 (H) 65 - 99 mg/dL  Glucose, capillary     Status: Abnormal   Collection Time: 06/20/16  6:50 PM  Result Value Ref Range   Glucose-Capillary 141 (H) 65 - 99 mg/dL  Glucose, capillary     Status: Abnormal   Collection Time: 06/20/16  8:10 PM  Result Value Ref Range   Glucose-Capillary 107 (H) 65 - 99 mg/dL  Glucose, capillary     Status: Abnormal   Collection Time: 06/20/16  9:21 PM  Result Value Ref Range   Glucose-Capillary 100 (H) 65 - 99 mg/dL  CBC     Status: Abnormal   Collection Time: 06/21/16  5:26 AM  Result Value Ref Range   WBC 12.7 (H) 4.0 - 10.5 K/uL   RBC 3.55 (L) 3.87 - 5.11 MIL/uL   Hemoglobin 11.6 (L) 12.0 - 15.0 g/dL   HCT 40.932.9 (L) 81.136.0 - 91.446.0 %   MCV 92.7 78.0 - 100.0 fL   MCH 32.7 26.0 - 34.0 pg   MCHC 35.3 30.0 - 36.0 g/dL   RDW 78.213.8 95.611.5 - 21.315.5 %   Platelets 150 150 - 400 K/uL   Abdomen is soft and non tender  PPD #1 doing well DM - good control Routine care circ today Discharge home tomorrow

## 2016-06-21 NOTE — Lactation Note (Signed)
This note was copied from a baby's chart. Lactation Consultation Note  Patient Name: [name] ZOXWR'UToday's Date: 06/21/2016 Reason for consult: Follow-up assessment  Baby 28 hours old. Mom reports that baby nursing well and she is hearing swallows at the breast. FOB asking about colostrum and milk coming to volume--all questions answered. Enc mom to call for assistance as needed.   Maternal Data    Feeding Feeding Type: Breast Fed  LATCH Score/Interventions Latch: Grasps breast easily, tongue down, lips flanged, rhythmical sucking.  Audible Swallowing: A few with stimulation  Type of Nipple: Everted at rest and after stimulation  Comfort (Breast/Nipple): Soft / non-tender     Hold (Positioning): Assistance needed to correctly position infant at breast and maintain latch. Intervention(s): Position options;Breastfeeding basics reviewed  LATCH Score: 8  Lactation Tools Discussed/Used     Consult Status Consult Status: Follow-up Date: 06/22/16 Follow-up type: In-patient    Sherlyn HayJennifer D Bevan Vu 06/21/2016, 8:09 PM

## 2016-06-21 NOTE — Progress Notes (Signed)
Pt's CBG 74. Breakfast at bedside.

## 2016-06-22 NOTE — Plan of Care (Signed)
Problem: Nutritional: Goal: Dietary intake will improve Outcome: Completed/Met Date Met: 06/22/16 Infant is breastfeeding well. Mother/ infant have been supported with breastfeeding by nursing and Lactation Consultants. Patient reports awareness of lactation support after discharge.

## 2016-06-22 NOTE — Lactation Note (Signed)
This note was copied from a baby's chart. Lactation Consultation Note  Patient Name: [name]  Mom states baby is nursing well.  Discussed milk coming to volume and engorgement treatment.  Also discussed possibility of lower insulin requirements once milk is established.  Mom knows to monitor this.  Lactation outpatient services and support information reviewed and encouraged.   Maternal Data    Feeding Feeding Type: Breast Fed  LATCH Score/Interventions Latch: Repeated attempts needed to sustain latch, nipple held in mouth throughout feeding, stimulation needed to elicit sucking reflex. Intervention(s): Assist with latch;Adjust position  Audible Swallowing: Spontaneous and intermittent Intervention(s): Hand expression;Alternate breast massage  Type of Nipple: Everted at rest and after stimulation  Comfort (Breast/Nipple): Soft / non-tender     Hold (Positioning): Assistance needed to correctly position infant at breast and maintain latch.  LATCH Score: 8  Lactation Tools Discussed/Used     Consult Status      Huston FoleyMOULDEN, Akshath Mccarey S [name], 10:20 AM

## 2016-06-22 NOTE — Discharge Summary (Signed)
Obstetric Discharge Summary Reason for Admission: induction of labor Prenatal Procedures: none Intrapartum Procedures: vacuum Postpartum Procedures: none Complications-Operative and Postpartum: 4th degree perineal laceration Hemoglobin  Date Value Ref Range Status  06/21/2016 11.6 (L) 12.0 - 15.0 g/dL Final   HCT  Date Value Ref Range Status  06/21/2016 32.9 (L) 36.0 - 46.0 % Final    Physical Exam:  General: alert, cooperative and appears stated age 22Lochia: appropriate Uterine Fundus: firm Incision: healing well, no significant drainage, no dehiscence DVT Evaluation: No evidence of DVT seen on physical exam.  Discharge Diagnoses: Term Pregnancy-delivered and Diabetes mellitus  Discharge Information: Date: 06/22/2016 Activity: pelvic rest Diet: routine Medications: Ibuprofen Condition: improved Instructions: refer to practice specific booklet Discharge to: home   Newborn Data: Live born female  Birth Weight: 8 lb 9.7 oz (3905 g) APGAR: 5, 8  Home with mother.  [name] L 06/22/2016, 7:20 AM

## 2016-07-17 DIAGNOSIS — E109 Type 1 diabetes mellitus without complications: Secondary | ICD-10-CM | POA: Diagnosis not present

## 2016-07-17 DIAGNOSIS — Z4681 Encounter for fitting and adjustment of insulin pump: Secondary | ICD-10-CM | POA: Diagnosis not present

## 2016-07-17 DIAGNOSIS — E1065 Type 1 diabetes mellitus with hyperglycemia: Secondary | ICD-10-CM | POA: Diagnosis not present

## 2016-07-17 DIAGNOSIS — Z6827 Body mass index (BMI) 27.0-27.9, adult: Secondary | ICD-10-CM | POA: Diagnosis not present

## 2016-07-30 DIAGNOSIS — Z1389 Encounter for screening for other disorder: Secondary | ICD-10-CM | POA: Diagnosis not present

## 2016-09-03 DIAGNOSIS — Z6828 Body mass index (BMI) 28.0-28.9, adult: Secondary | ICD-10-CM | POA: Diagnosis not present

## 2016-09-03 DIAGNOSIS — Z4681 Encounter for fitting and adjustment of insulin pump: Secondary | ICD-10-CM | POA: Diagnosis not present

## 2016-09-03 DIAGNOSIS — E1065 Type 1 diabetes mellitus with hyperglycemia: Secondary | ICD-10-CM | POA: Diagnosis not present

## 2016-09-05 DIAGNOSIS — E1065 Type 1 diabetes mellitus with hyperglycemia: Secondary | ICD-10-CM | POA: Diagnosis not present

## 2016-09-05 DIAGNOSIS — E109 Type 1 diabetes mellitus without complications: Secondary | ICD-10-CM | POA: Diagnosis not present

## 2016-10-02 DIAGNOSIS — Z23 Encounter for immunization: Secondary | ICD-10-CM | POA: Diagnosis not present

## 2016-10-02 DIAGNOSIS — E048 Other specified nontoxic goiter: Secondary | ICD-10-CM | POA: Diagnosis not present

## 2016-10-02 DIAGNOSIS — R740 Nonspecific elevation of levels of transaminase and lactic acid dehydrogenase [LDH]: Secondary | ICD-10-CM | POA: Diagnosis not present

## 2016-10-02 DIAGNOSIS — E109 Type 1 diabetes mellitus without complications: Secondary | ICD-10-CM | POA: Diagnosis not present

## 2016-10-02 DIAGNOSIS — E784 Other hyperlipidemia: Secondary | ICD-10-CM | POA: Diagnosis not present

## 2016-10-02 DIAGNOSIS — E1065 Type 1 diabetes mellitus with hyperglycemia: Secondary | ICD-10-CM | POA: Diagnosis not present

## 2016-10-30 DIAGNOSIS — E1065 Type 1 diabetes mellitus with hyperglycemia: Secondary | ICD-10-CM | POA: Diagnosis not present

## 2016-10-30 DIAGNOSIS — Z6829 Body mass index (BMI) 29.0-29.9, adult: Secondary | ICD-10-CM | POA: Diagnosis not present

## 2016-10-30 DIAGNOSIS — Z4681 Encounter for fitting and adjustment of insulin pump: Secondary | ICD-10-CM | POA: Diagnosis not present

## 2016-12-03 DIAGNOSIS — L03317 Cellulitis of buttock: Secondary | ICD-10-CM | POA: Diagnosis not present

## 2016-12-03 DIAGNOSIS — Z6828 Body mass index (BMI) 28.0-28.9, adult: Secondary | ICD-10-CM | POA: Diagnosis not present

## 2016-12-03 DIAGNOSIS — E1065 Type 1 diabetes mellitus with hyperglycemia: Secondary | ICD-10-CM | POA: Diagnosis not present

## 2016-12-03 DIAGNOSIS — Z1389 Encounter for screening for other disorder: Secondary | ICD-10-CM | POA: Diagnosis not present

## 2017-01-22 DIAGNOSIS — Z4681 Encounter for fitting and adjustment of insulin pump: Secondary | ICD-10-CM | POA: Diagnosis not present

## 2017-01-22 DIAGNOSIS — E1065 Type 1 diabetes mellitus with hyperglycemia: Secondary | ICD-10-CM | POA: Diagnosis not present

## 2017-01-22 DIAGNOSIS — Z794 Long term (current) use of insulin: Secondary | ICD-10-CM | POA: Diagnosis not present

## 2017-01-22 DIAGNOSIS — L03019 Cellulitis of unspecified finger: Secondary | ICD-10-CM | POA: Diagnosis not present

## 2017-02-02 DIAGNOSIS — E109 Type 1 diabetes mellitus without complications: Secondary | ICD-10-CM | POA: Diagnosis not present

## 2017-03-18 DIAGNOSIS — R05 Cough: Secondary | ICD-10-CM | POA: Diagnosis not present

## 2017-03-19 DIAGNOSIS — Z794 Long term (current) use of insulin: Secondary | ICD-10-CM | POA: Diagnosis not present

## 2017-03-19 DIAGNOSIS — E1065 Type 1 diabetes mellitus with hyperglycemia: Secondary | ICD-10-CM | POA: Diagnosis not present

## 2017-03-19 DIAGNOSIS — Z4681 Encounter for fitting and adjustment of insulin pump: Secondary | ICD-10-CM | POA: Diagnosis not present

## 2017-03-19 DIAGNOSIS — Z6829 Body mass index (BMI) 29.0-29.9, adult: Secondary | ICD-10-CM | POA: Diagnosis not present

## 2017-05-14 DIAGNOSIS — Z6830 Body mass index (BMI) 30.0-30.9, adult: Secondary | ICD-10-CM | POA: Diagnosis not present

## 2017-05-14 DIAGNOSIS — E1065 Type 1 diabetes mellitus with hyperglycemia: Secondary | ICD-10-CM | POA: Diagnosis not present

## 2017-05-14 DIAGNOSIS — Z794 Long term (current) use of insulin: Secondary | ICD-10-CM | POA: Diagnosis not present

## 2017-05-14 DIAGNOSIS — Z4681 Encounter for fitting and adjustment of insulin pump: Secondary | ICD-10-CM | POA: Diagnosis not present

## 2017-07-23 DIAGNOSIS — Z6830 Body mass index (BMI) 30.0-30.9, adult: Secondary | ICD-10-CM | POA: Diagnosis not present

## 2017-07-23 DIAGNOSIS — Z794 Long term (current) use of insulin: Secondary | ICD-10-CM | POA: Diagnosis not present

## 2017-07-23 DIAGNOSIS — Z4681 Encounter for fitting and adjustment of insulin pump: Secondary | ICD-10-CM | POA: Diagnosis not present

## 2017-07-23 DIAGNOSIS — E1065 Type 1 diabetes mellitus with hyperglycemia: Secondary | ICD-10-CM | POA: Diagnosis not present

## 2017-09-04 DIAGNOSIS — E1065 Type 1 diabetes mellitus with hyperglycemia: Secondary | ICD-10-CM | POA: Diagnosis not present

## 2017-10-29 DIAGNOSIS — Z4681 Encounter for fitting and adjustment of insulin pump: Secondary | ICD-10-CM | POA: Diagnosis not present

## 2017-10-29 DIAGNOSIS — Z6830 Body mass index (BMI) 30.0-30.9, adult: Secondary | ICD-10-CM | POA: Diagnosis not present

## 2017-10-29 DIAGNOSIS — Z794 Long term (current) use of insulin: Secondary | ICD-10-CM | POA: Diagnosis not present

## 2017-10-29 DIAGNOSIS — Z23 Encounter for immunization: Secondary | ICD-10-CM | POA: Diagnosis not present

## 2017-10-29 DIAGNOSIS — E1065 Type 1 diabetes mellitus with hyperglycemia: Secondary | ICD-10-CM | POA: Diagnosis not present

## 2017-11-12 DIAGNOSIS — D485 Neoplasm of uncertain behavior of skin: Secondary | ICD-10-CM | POA: Diagnosis not present

## 2017-12-17 DIAGNOSIS — Z6832 Body mass index (BMI) 32.0-32.9, adult: Secondary | ICD-10-CM | POA: Diagnosis not present

## 2017-12-17 DIAGNOSIS — Z01419 Encounter for gynecological examination (general) (routine) without abnormal findings: Secondary | ICD-10-CM | POA: Diagnosis not present

## 2018-01-29 DIAGNOSIS — E109 Type 1 diabetes mellitus without complications: Secondary | ICD-10-CM | POA: Diagnosis not present

## 2018-02-04 DIAGNOSIS — Z4681 Encounter for fitting and adjustment of insulin pump: Secondary | ICD-10-CM | POA: Diagnosis not present

## 2018-02-04 DIAGNOSIS — Z794 Long term (current) use of insulin: Secondary | ICD-10-CM | POA: Diagnosis not present

## 2018-02-04 DIAGNOSIS — E1065 Type 1 diabetes mellitus with hyperglycemia: Secondary | ICD-10-CM | POA: Diagnosis not present

## 2018-02-04 DIAGNOSIS — Z6830 Body mass index (BMI) 30.0-30.9, adult: Secondary | ICD-10-CM | POA: Diagnosis not present

## 2018-05-06 DIAGNOSIS — Z4681 Encounter for fitting and adjustment of insulin pump: Secondary | ICD-10-CM | POA: Diagnosis not present

## 2018-05-06 DIAGNOSIS — E1065 Type 1 diabetes mellitus with hyperglycemia: Secondary | ICD-10-CM | POA: Diagnosis not present

## 2018-05-06 DIAGNOSIS — Z794 Long term (current) use of insulin: Secondary | ICD-10-CM | POA: Diagnosis not present

## 2018-06-17 DIAGNOSIS — Z794 Long term (current) use of insulin: Secondary | ICD-10-CM | POA: Diagnosis not present

## 2018-06-17 DIAGNOSIS — Z4681 Encounter for fitting and adjustment of insulin pump: Secondary | ICD-10-CM | POA: Diagnosis not present

## 2018-06-17 DIAGNOSIS — E1065 Type 1 diabetes mellitus with hyperglycemia: Secondary | ICD-10-CM | POA: Diagnosis not present

## 2018-06-17 DIAGNOSIS — E109 Type 1 diabetes mellitus without complications: Secondary | ICD-10-CM | POA: Diagnosis not present

## 2018-07-29 DIAGNOSIS — Z4681 Encounter for fitting and adjustment of insulin pump: Secondary | ICD-10-CM | POA: Diagnosis not present

## 2018-07-29 DIAGNOSIS — E1065 Type 1 diabetes mellitus with hyperglycemia: Secondary | ICD-10-CM | POA: Diagnosis not present

## 2018-07-29 DIAGNOSIS — Z794 Long term (current) use of insulin: Secondary | ICD-10-CM | POA: Diagnosis not present

## 2018-09-30 DIAGNOSIS — E049 Nontoxic goiter, unspecified: Secondary | ICD-10-CM | POA: Diagnosis not present

## 2018-09-30 DIAGNOSIS — Z1331 Encounter for screening for depression: Secondary | ICD-10-CM | POA: Diagnosis not present

## 2018-09-30 DIAGNOSIS — E1065 Type 1 diabetes mellitus with hyperglycemia: Secondary | ICD-10-CM | POA: Diagnosis not present

## 2018-09-30 DIAGNOSIS — Z794 Long term (current) use of insulin: Secondary | ICD-10-CM | POA: Diagnosis not present

## 2018-09-30 DIAGNOSIS — R740 Nonspecific elevation of levels of transaminase and lactic acid dehydrogenase [LDH]: Secondary | ICD-10-CM | POA: Diagnosis not present

## 2018-10-05 DIAGNOSIS — J020 Streptococcal pharyngitis: Secondary | ICD-10-CM | POA: Diagnosis not present

## 2018-10-05 DIAGNOSIS — E1065 Type 1 diabetes mellitus with hyperglycemia: Secondary | ICD-10-CM | POA: Diagnosis not present

## 2018-11-11 DIAGNOSIS — Z23 Encounter for immunization: Secondary | ICD-10-CM | POA: Diagnosis not present

## 2018-11-11 DIAGNOSIS — E1065 Type 1 diabetes mellitus with hyperglycemia: Secondary | ICD-10-CM | POA: Diagnosis not present

## 2018-11-11 DIAGNOSIS — Z4681 Encounter for fitting and adjustment of insulin pump: Secondary | ICD-10-CM | POA: Diagnosis not present

## 2018-11-11 DIAGNOSIS — Z794 Long term (current) use of insulin: Secondary | ICD-10-CM | POA: Diagnosis not present

## 2019-01-04 DIAGNOSIS — J029 Acute pharyngitis, unspecified: Secondary | ICD-10-CM | POA: Diagnosis not present

## 2019-01-04 DIAGNOSIS — J020 Streptococcal pharyngitis: Secondary | ICD-10-CM | POA: Diagnosis not present

## 2019-01-06 DIAGNOSIS — E109 Type 1 diabetes mellitus without complications: Secondary | ICD-10-CM | POA: Diagnosis not present

## 2019-01-18 DIAGNOSIS — E1065 Type 1 diabetes mellitus with hyperglycemia: Secondary | ICD-10-CM | POA: Diagnosis not present

## 2019-01-18 DIAGNOSIS — E669 Obesity, unspecified: Secondary | ICD-10-CM | POA: Diagnosis not present

## 2019-01-18 DIAGNOSIS — R7401 Elevation of levels of liver transaminase levels: Secondary | ICD-10-CM | POA: Diagnosis not present

## 2019-05-19 DIAGNOSIS — R7401 Elevation of levels of liver transaminase levels: Secondary | ICD-10-CM | POA: Diagnosis not present

## 2019-05-19 DIAGNOSIS — E049 Nontoxic goiter, unspecified: Secondary | ICD-10-CM | POA: Diagnosis not present

## 2019-05-19 DIAGNOSIS — Z794 Long term (current) use of insulin: Secondary | ICD-10-CM | POA: Diagnosis not present

## 2019-05-19 DIAGNOSIS — E1065 Type 1 diabetes mellitus with hyperglycemia: Secondary | ICD-10-CM | POA: Diagnosis not present

## 2019-06-02 DIAGNOSIS — E109 Type 1 diabetes mellitus without complications: Secondary | ICD-10-CM | POA: Diagnosis not present

## 2019-06-23 DIAGNOSIS — E1065 Type 1 diabetes mellitus with hyperglycemia: Secondary | ICD-10-CM | POA: Diagnosis not present

## 2019-06-23 DIAGNOSIS — Z794 Long term (current) use of insulin: Secondary | ICD-10-CM | POA: Diagnosis not present

## 2019-06-23 DIAGNOSIS — Z4681 Encounter for fitting and adjustment of insulin pump: Secondary | ICD-10-CM | POA: Diagnosis not present

## 2019-08-02 DIAGNOSIS — Z6832 Body mass index (BMI) 32.0-32.9, adult: Secondary | ICD-10-CM | POA: Diagnosis not present

## 2019-08-02 DIAGNOSIS — Z01419 Encounter for gynecological examination (general) (routine) without abnormal findings: Secondary | ICD-10-CM | POA: Diagnosis not present

## 2019-08-05 DIAGNOSIS — E109 Type 1 diabetes mellitus without complications: Secondary | ICD-10-CM | POA: Diagnosis not present

## 2019-08-05 DIAGNOSIS — E1065 Type 1 diabetes mellitus with hyperglycemia: Secondary | ICD-10-CM | POA: Diagnosis not present

## 2019-08-29 DIAGNOSIS — E1065 Type 1 diabetes mellitus with hyperglycemia: Secondary | ICD-10-CM | POA: Diagnosis not present

## 2019-08-29 DIAGNOSIS — E049 Nontoxic goiter, unspecified: Secondary | ICD-10-CM | POA: Diagnosis not present

## 2019-08-29 DIAGNOSIS — E785 Hyperlipidemia, unspecified: Secondary | ICD-10-CM | POA: Diagnosis not present

## 2019-08-29 DIAGNOSIS — E669 Obesity, unspecified: Secondary | ICD-10-CM | POA: Diagnosis not present

## 2019-10-12 DIAGNOSIS — E109 Type 1 diabetes mellitus without complications: Secondary | ICD-10-CM | POA: Diagnosis not present

## 2019-12-21 DIAGNOSIS — E1065 Type 1 diabetes mellitus with hyperglycemia: Secondary | ICD-10-CM | POA: Diagnosis not present

## 2019-12-21 DIAGNOSIS — Z23 Encounter for immunization: Secondary | ICD-10-CM | POA: Diagnosis not present

## 2019-12-21 DIAGNOSIS — E785 Hyperlipidemia, unspecified: Secondary | ICD-10-CM | POA: Diagnosis not present

## 2020-01-19 DIAGNOSIS — E1065 Type 1 diabetes mellitus with hyperglycemia: Secondary | ICD-10-CM | POA: Diagnosis not present

## 2020-03-30 DIAGNOSIS — Z20828 Contact with and (suspected) exposure to other viral communicable diseases: Secondary | ICD-10-CM | POA: Diagnosis not present

## 2020-05-24 DIAGNOSIS — E1065 Type 1 diabetes mellitus with hyperglycemia: Secondary | ICD-10-CM | POA: Diagnosis not present

## 2020-05-30 DIAGNOSIS — M5442 Lumbago with sciatica, left side: Secondary | ICD-10-CM | POA: Diagnosis not present

## 2020-05-30 DIAGNOSIS — M5413 Radiculopathy, cervicothoracic region: Secondary | ICD-10-CM | POA: Diagnosis not present

## 2020-05-30 DIAGNOSIS — M546 Pain in thoracic spine: Secondary | ICD-10-CM | POA: Diagnosis not present

## 2020-05-31 DIAGNOSIS — M546 Pain in thoracic spine: Secondary | ICD-10-CM | POA: Diagnosis not present

## 2020-05-31 DIAGNOSIS — M5442 Lumbago with sciatica, left side: Secondary | ICD-10-CM | POA: Diagnosis not present

## 2020-05-31 DIAGNOSIS — M5413 Radiculopathy, cervicothoracic region: Secondary | ICD-10-CM | POA: Diagnosis not present

## 2020-06-04 DIAGNOSIS — M5442 Lumbago with sciatica, left side: Secondary | ICD-10-CM | POA: Diagnosis not present

## 2020-06-04 DIAGNOSIS — M5413 Radiculopathy, cervicothoracic region: Secondary | ICD-10-CM | POA: Diagnosis not present

## 2020-06-04 DIAGNOSIS — M546 Pain in thoracic spine: Secondary | ICD-10-CM | POA: Diagnosis not present

## 2020-06-05 DIAGNOSIS — M5442 Lumbago with sciatica, left side: Secondary | ICD-10-CM | POA: Diagnosis not present

## 2020-06-05 DIAGNOSIS — M546 Pain in thoracic spine: Secondary | ICD-10-CM | POA: Diagnosis not present

## 2020-06-05 DIAGNOSIS — M5413 Radiculopathy, cervicothoracic region: Secondary | ICD-10-CM | POA: Diagnosis not present

## 2020-06-07 DIAGNOSIS — M5442 Lumbago with sciatica, left side: Secondary | ICD-10-CM | POA: Diagnosis not present

## 2020-06-07 DIAGNOSIS — M5413 Radiculopathy, cervicothoracic region: Secondary | ICD-10-CM | POA: Diagnosis not present

## 2020-06-07 DIAGNOSIS — M546 Pain in thoracic spine: Secondary | ICD-10-CM | POA: Diagnosis not present

## 2020-06-11 DIAGNOSIS — M5442 Lumbago with sciatica, left side: Secondary | ICD-10-CM | POA: Diagnosis not present

## 2020-06-11 DIAGNOSIS — M5413 Radiculopathy, cervicothoracic region: Secondary | ICD-10-CM | POA: Diagnosis not present

## 2020-06-11 DIAGNOSIS — M546 Pain in thoracic spine: Secondary | ICD-10-CM | POA: Diagnosis not present

## 2020-06-12 DIAGNOSIS — M546 Pain in thoracic spine: Secondary | ICD-10-CM | POA: Diagnosis not present

## 2020-06-12 DIAGNOSIS — M5413 Radiculopathy, cervicothoracic region: Secondary | ICD-10-CM | POA: Diagnosis not present

## 2020-06-12 DIAGNOSIS — M5442 Lumbago with sciatica, left side: Secondary | ICD-10-CM | POA: Diagnosis not present

## 2020-06-14 DIAGNOSIS — M5442 Lumbago with sciatica, left side: Secondary | ICD-10-CM | POA: Diagnosis not present

## 2020-06-14 DIAGNOSIS — M5413 Radiculopathy, cervicothoracic region: Secondary | ICD-10-CM | POA: Diagnosis not present

## 2020-06-14 DIAGNOSIS — M546 Pain in thoracic spine: Secondary | ICD-10-CM | POA: Diagnosis not present

## 2020-06-18 DIAGNOSIS — M5413 Radiculopathy, cervicothoracic region: Secondary | ICD-10-CM | POA: Diagnosis not present

## 2020-06-18 DIAGNOSIS — M5442 Lumbago with sciatica, left side: Secondary | ICD-10-CM | POA: Diagnosis not present

## 2020-06-18 DIAGNOSIS — M546 Pain in thoracic spine: Secondary | ICD-10-CM | POA: Diagnosis not present

## 2020-06-19 DIAGNOSIS — E1065 Type 1 diabetes mellitus with hyperglycemia: Secondary | ICD-10-CM | POA: Diagnosis not present

## 2020-06-21 DIAGNOSIS — M5442 Lumbago with sciatica, left side: Secondary | ICD-10-CM | POA: Diagnosis not present

## 2020-06-21 DIAGNOSIS — M546 Pain in thoracic spine: Secondary | ICD-10-CM | POA: Diagnosis not present

## 2020-06-21 DIAGNOSIS — M5413 Radiculopathy, cervicothoracic region: Secondary | ICD-10-CM | POA: Diagnosis not present

## 2020-06-25 DIAGNOSIS — M5413 Radiculopathy, cervicothoracic region: Secondary | ICD-10-CM | POA: Diagnosis not present

## 2020-06-25 DIAGNOSIS — M5442 Lumbago with sciatica, left side: Secondary | ICD-10-CM | POA: Diagnosis not present

## 2020-06-25 DIAGNOSIS — M546 Pain in thoracic spine: Secondary | ICD-10-CM | POA: Diagnosis not present

## 2020-06-26 DIAGNOSIS — M5413 Radiculopathy, cervicothoracic region: Secondary | ICD-10-CM | POA: Diagnosis not present

## 2020-06-26 DIAGNOSIS — M546 Pain in thoracic spine: Secondary | ICD-10-CM | POA: Diagnosis not present

## 2020-06-26 DIAGNOSIS — M5442 Lumbago with sciatica, left side: Secondary | ICD-10-CM | POA: Diagnosis not present

## 2020-06-28 DIAGNOSIS — M546 Pain in thoracic spine: Secondary | ICD-10-CM | POA: Diagnosis not present

## 2020-06-28 DIAGNOSIS — M5442 Lumbago with sciatica, left side: Secondary | ICD-10-CM | POA: Diagnosis not present

## 2020-06-28 DIAGNOSIS — M5413 Radiculopathy, cervicothoracic region: Secondary | ICD-10-CM | POA: Diagnosis not present

## 2020-07-03 DIAGNOSIS — M5442 Lumbago with sciatica, left side: Secondary | ICD-10-CM | POA: Diagnosis not present

## 2020-07-03 DIAGNOSIS — M546 Pain in thoracic spine: Secondary | ICD-10-CM | POA: Diagnosis not present

## 2020-07-03 DIAGNOSIS — M5413 Radiculopathy, cervicothoracic region: Secondary | ICD-10-CM | POA: Diagnosis not present

## 2020-07-04 DIAGNOSIS — M546 Pain in thoracic spine: Secondary | ICD-10-CM | POA: Diagnosis not present

## 2020-07-04 DIAGNOSIS — M5413 Radiculopathy, cervicothoracic region: Secondary | ICD-10-CM | POA: Diagnosis not present

## 2020-07-04 DIAGNOSIS — M5442 Lumbago with sciatica, left side: Secondary | ICD-10-CM | POA: Diagnosis not present

## 2020-07-09 DIAGNOSIS — M546 Pain in thoracic spine: Secondary | ICD-10-CM | POA: Diagnosis not present

## 2020-07-09 DIAGNOSIS — M5413 Radiculopathy, cervicothoracic region: Secondary | ICD-10-CM | POA: Diagnosis not present

## 2020-07-09 DIAGNOSIS — M5442 Lumbago with sciatica, left side: Secondary | ICD-10-CM | POA: Diagnosis not present

## 2020-07-10 DIAGNOSIS — M5442 Lumbago with sciatica, left side: Secondary | ICD-10-CM | POA: Diagnosis not present

## 2020-07-10 DIAGNOSIS — M546 Pain in thoracic spine: Secondary | ICD-10-CM | POA: Diagnosis not present

## 2020-07-10 DIAGNOSIS — M5413 Radiculopathy, cervicothoracic region: Secondary | ICD-10-CM | POA: Diagnosis not present

## 2020-07-12 DIAGNOSIS — M5413 Radiculopathy, cervicothoracic region: Secondary | ICD-10-CM | POA: Diagnosis not present

## 2020-07-12 DIAGNOSIS — M546 Pain in thoracic spine: Secondary | ICD-10-CM | POA: Diagnosis not present

## 2020-07-12 DIAGNOSIS — M5442 Lumbago with sciatica, left side: Secondary | ICD-10-CM | POA: Diagnosis not present

## 2020-07-16 DIAGNOSIS — M5442 Lumbago with sciatica, left side: Secondary | ICD-10-CM | POA: Diagnosis not present

## 2020-07-16 DIAGNOSIS — M5413 Radiculopathy, cervicothoracic region: Secondary | ICD-10-CM | POA: Diagnosis not present

## 2020-07-16 DIAGNOSIS — M546 Pain in thoracic spine: Secondary | ICD-10-CM | POA: Diagnosis not present

## 2020-07-17 DIAGNOSIS — M5413 Radiculopathy, cervicothoracic region: Secondary | ICD-10-CM | POA: Diagnosis not present

## 2020-07-17 DIAGNOSIS — M5442 Lumbago with sciatica, left side: Secondary | ICD-10-CM | POA: Diagnosis not present

## 2020-07-17 DIAGNOSIS — M546 Pain in thoracic spine: Secondary | ICD-10-CM | POA: Diagnosis not present

## 2020-07-19 DIAGNOSIS — M5413 Radiculopathy, cervicothoracic region: Secondary | ICD-10-CM | POA: Diagnosis not present

## 2020-07-19 DIAGNOSIS — M546 Pain in thoracic spine: Secondary | ICD-10-CM | POA: Diagnosis not present

## 2020-07-19 DIAGNOSIS — M5442 Lumbago with sciatica, left side: Secondary | ICD-10-CM | POA: Diagnosis not present

## 2020-08-14 DIAGNOSIS — Z01419 Encounter for gynecological examination (general) (routine) without abnormal findings: Secondary | ICD-10-CM | POA: Diagnosis not present

## 2020-08-14 DIAGNOSIS — Z6833 Body mass index (BMI) 33.0-33.9, adult: Secondary | ICD-10-CM | POA: Diagnosis not present

## 2020-08-14 DIAGNOSIS — N920 Excessive and frequent menstruation with regular cycle: Secondary | ICD-10-CM | POA: Diagnosis not present

## 2020-08-16 DIAGNOSIS — M546 Pain in thoracic spine: Secondary | ICD-10-CM | POA: Diagnosis not present

## 2020-08-16 DIAGNOSIS — M5413 Radiculopathy, cervicothoracic region: Secondary | ICD-10-CM | POA: Diagnosis not present

## 2020-08-16 DIAGNOSIS — M5442 Lumbago with sciatica, left side: Secondary | ICD-10-CM | POA: Diagnosis not present

## 2020-08-30 DIAGNOSIS — E1065 Type 1 diabetes mellitus with hyperglycemia: Secondary | ICD-10-CM | POA: Diagnosis not present

## 2020-09-04 DIAGNOSIS — N920 Excessive and frequent menstruation with regular cycle: Secondary | ICD-10-CM | POA: Diagnosis not present

## 2020-10-24 DIAGNOSIS — N920 Excessive and frequent menstruation with regular cycle: Secondary | ICD-10-CM | POA: Diagnosis not present

## 2020-11-14 DIAGNOSIS — E1065 Type 1 diabetes mellitus with hyperglycemia: Secondary | ICD-10-CM | POA: Diagnosis not present

## 2020-11-14 DIAGNOSIS — Z23 Encounter for immunization: Secondary | ICD-10-CM | POA: Diagnosis not present

## 2020-11-14 DIAGNOSIS — E785 Hyperlipidemia, unspecified: Secondary | ICD-10-CM | POA: Diagnosis not present

## 2020-11-14 DIAGNOSIS — E049 Nontoxic goiter, unspecified: Secondary | ICD-10-CM | POA: Diagnosis not present

## 2021-01-29 DIAGNOSIS — E1065 Type 1 diabetes mellitus with hyperglycemia: Secondary | ICD-10-CM | POA: Diagnosis not present

## 2021-03-20 DIAGNOSIS — E1065 Type 1 diabetes mellitus with hyperglycemia: Secondary | ICD-10-CM | POA: Diagnosis not present

## 2021-03-20 DIAGNOSIS — Z1331 Encounter for screening for depression: Secondary | ICD-10-CM | POA: Diagnosis not present

## 2021-03-23 DIAGNOSIS — U071 COVID-19: Secondary | ICD-10-CM | POA: Diagnosis not present

## 2021-08-13 DIAGNOSIS — E1065 Type 1 diabetes mellitus with hyperglycemia: Secondary | ICD-10-CM | POA: Diagnosis not present

## 2021-09-26 DIAGNOSIS — E1065 Type 1 diabetes mellitus with hyperglycemia: Secondary | ICD-10-CM | POA: Diagnosis not present

## 2021-09-26 DIAGNOSIS — R809 Proteinuria, unspecified: Secondary | ICD-10-CM | POA: Diagnosis not present

## 2021-12-18 DIAGNOSIS — E1065 Type 1 diabetes mellitus with hyperglycemia: Secondary | ICD-10-CM | POA: Diagnosis not present

## 2021-12-18 DIAGNOSIS — E785 Hyperlipidemia, unspecified: Secondary | ICD-10-CM | POA: Diagnosis not present

## 2021-12-18 DIAGNOSIS — E049 Nontoxic goiter, unspecified: Secondary | ICD-10-CM | POA: Diagnosis not present

## 2021-12-18 DIAGNOSIS — Z23 Encounter for immunization: Secondary | ICD-10-CM | POA: Diagnosis not present

## 2022-02-20 DIAGNOSIS — E1065 Type 1 diabetes mellitus with hyperglycemia: Secondary | ICD-10-CM | POA: Diagnosis not present

## 2022-02-20 DIAGNOSIS — E785 Hyperlipidemia, unspecified: Secondary | ICD-10-CM | POA: Diagnosis not present

## 2022-04-10 DIAGNOSIS — E669 Obesity, unspecified: Secondary | ICD-10-CM | POA: Diagnosis not present

## 2022-04-10 DIAGNOSIS — E785 Hyperlipidemia, unspecified: Secondary | ICD-10-CM | POA: Diagnosis not present

## 2022-04-10 DIAGNOSIS — E1065 Type 1 diabetes mellitus with hyperglycemia: Secondary | ICD-10-CM | POA: Diagnosis not present

## 2022-04-10 DIAGNOSIS — E049 Nontoxic goiter, unspecified: Secondary | ICD-10-CM | POA: Diagnosis not present

## 2022-06-02 DIAGNOSIS — R070 Pain in throat: Secondary | ICD-10-CM | POA: Diagnosis not present

## 2022-06-02 DIAGNOSIS — R42 Dizziness and giddiness: Secondary | ICD-10-CM | POA: Diagnosis not present

## 2022-06-02 DIAGNOSIS — J020 Streptococcal pharyngitis: Secondary | ICD-10-CM | POA: Diagnosis not present

## 2022-06-19 DIAGNOSIS — E785 Hyperlipidemia, unspecified: Secondary | ICD-10-CM | POA: Diagnosis not present

## 2022-06-19 DIAGNOSIS — Z4681 Encounter for fitting and adjustment of insulin pump: Secondary | ICD-10-CM | POA: Diagnosis not present

## 2022-06-19 DIAGNOSIS — Z794 Long term (current) use of insulin: Secondary | ICD-10-CM | POA: Diagnosis not present

## 2022-06-19 DIAGNOSIS — E1065 Type 1 diabetes mellitus with hyperglycemia: Secondary | ICD-10-CM | POA: Diagnosis not present

## 2022-08-12 DIAGNOSIS — E1065 Type 1 diabetes mellitus with hyperglycemia: Secondary | ICD-10-CM | POA: Diagnosis not present

## 2022-08-12 DIAGNOSIS — E049 Nontoxic goiter, unspecified: Secondary | ICD-10-CM | POA: Diagnosis not present

## 2022-10-16 DIAGNOSIS — Z23 Encounter for immunization: Secondary | ICD-10-CM | POA: Diagnosis not present

## 2022-10-16 DIAGNOSIS — R809 Proteinuria, unspecified: Secondary | ICD-10-CM | POA: Diagnosis not present

## 2022-10-16 DIAGNOSIS — E1065 Type 1 diabetes mellitus with hyperglycemia: Secondary | ICD-10-CM | POA: Diagnosis not present

## 2022-12-12 DIAGNOSIS — E1065 Type 1 diabetes mellitus with hyperglycemia: Secondary | ICD-10-CM | POA: Diagnosis not present

## 2023-01-06 DIAGNOSIS — R5383 Other fatigue: Secondary | ICD-10-CM | POA: Diagnosis not present

## 2023-01-06 DIAGNOSIS — J189 Pneumonia, unspecified organism: Secondary | ICD-10-CM | POA: Diagnosis not present

## 2023-01-06 DIAGNOSIS — R519 Headache, unspecified: Secondary | ICD-10-CM | POA: Diagnosis not present

## 2023-01-08 ENCOUNTER — Other Ambulatory Visit (HOSPITAL_COMMUNITY): Payer: Self-pay

## 2023-02-19 DIAGNOSIS — E1065 Type 1 diabetes mellitus with hyperglycemia: Secondary | ICD-10-CM | POA: Diagnosis not present

## 2023-03-09 DIAGNOSIS — Z6833 Body mass index (BMI) 33.0-33.9, adult: Secondary | ICD-10-CM | POA: Diagnosis not present

## 2023-03-09 DIAGNOSIS — Z01419 Encounter for gynecological examination (general) (routine) without abnormal findings: Secondary | ICD-10-CM | POA: Diagnosis not present

## 2023-03-09 DIAGNOSIS — R87610 Atypical squamous cells of undetermined significance on cytologic smear of cervix (ASC-US): Secondary | ICD-10-CM | POA: Diagnosis not present

## 2023-04-16 DIAGNOSIS — E049 Nontoxic goiter, unspecified: Secondary | ICD-10-CM | POA: Diagnosis not present

## 2023-04-16 DIAGNOSIS — E785 Hyperlipidemia, unspecified: Secondary | ICD-10-CM | POA: Diagnosis not present

## 2023-04-16 DIAGNOSIS — E1065 Type 1 diabetes mellitus with hyperglycemia: Secondary | ICD-10-CM | POA: Diagnosis not present

## 2023-07-28 DIAGNOSIS — E1065 Type 1 diabetes mellitus with hyperglycemia: Secondary | ICD-10-CM | POA: Diagnosis not present

## 2023-07-28 DIAGNOSIS — E049 Nontoxic goiter, unspecified: Secondary | ICD-10-CM | POA: Diagnosis not present

## 2023-07-28 DIAGNOSIS — R5383 Other fatigue: Secondary | ICD-10-CM | POA: Diagnosis not present

## 2023-09-02 DIAGNOSIS — E1065 Type 1 diabetes mellitus with hyperglycemia: Secondary | ICD-10-CM | POA: Diagnosis not present

## 2023-09-02 DIAGNOSIS — R5383 Other fatigue: Secondary | ICD-10-CM | POA: Diagnosis not present

## 2023-10-13 DIAGNOSIS — E1065 Type 1 diabetes mellitus with hyperglycemia: Secondary | ICD-10-CM | POA: Diagnosis not present

## 2023-10-13 DIAGNOSIS — Z23 Encounter for immunization: Secondary | ICD-10-CM | POA: Diagnosis not present

## 2023-11-24 DIAGNOSIS — E1065 Type 1 diabetes mellitus with hyperglycemia: Secondary | ICD-10-CM | POA: Diagnosis not present

## 2023-12-24 DIAGNOSIS — R070 Pain in throat: Secondary | ICD-10-CM | POA: Diagnosis not present
